# Patient Record
Sex: Female | Born: 1945 | Race: White | Hispanic: No | Marital: Married | Smoking: Former smoker
Health system: Southern US, Community
[De-identification: ages and names within clinical notes are randomized; demographics above are authoritative.]

## PROBLEM LIST (undated history)

## (undated) DIAGNOSIS — I82409 Acute embolism and thrombosis of unspecified deep veins of unspecified lower extremity: Secondary | ICD-10-CM

## (undated) DIAGNOSIS — I809 Phlebitis and thrombophlebitis of unspecified site: Secondary | ICD-10-CM

## (undated) DIAGNOSIS — I2699 Other pulmonary embolism without acute cor pulmonale: Secondary | ICD-10-CM

## (undated) DIAGNOSIS — Z9289 Personal history of other medical treatment: Secondary | ICD-10-CM

## (undated) DIAGNOSIS — T7840XA Allergy, unspecified, initial encounter: Secondary | ICD-10-CM

## (undated) DIAGNOSIS — I491 Atrial premature depolarization: Secondary | ICD-10-CM

## (undated) DIAGNOSIS — M8580 Other specified disorders of bone density and structure, unspecified site: Secondary | ICD-10-CM

## (undated) HISTORY — PX: BONE TUMOR EXCISION: SHX1254

## (undated) HISTORY — PX: COLONOSCOPY: SHX174

## (undated) HISTORY — PX: TONSILLECTOMY AND ADENOIDECTOMY: SUR1326

## (undated) HISTORY — DX: Personal history of other medical treatment: Z92.89

## (undated) HISTORY — DX: Atrial premature depolarization: I49.1

## (undated) HISTORY — PX: POLYPECTOMY: SHX149

## (undated) HISTORY — DX: Other specified disorders of bone density and structure, unspecified site: M85.80

## (undated) HISTORY — PX: OTHER SURGICAL HISTORY: SHX169

## (undated) HISTORY — DX: Phlebitis and thrombophlebitis of unspecified site: I80.9

## (undated) HISTORY — DX: Allergy, unspecified, initial encounter: T78.40XA

---
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
## 1983-08-06 HISTORY — PX: TUBAL LIGATION: SHX77

## 1999-04-02 ENCOUNTER — Other Ambulatory Visit: Admission: RE | Admit: 1999-04-02 | Discharge: 1999-04-02 | Payer: Self-pay | Admitting: Gynecology

## 2003-04-07 ENCOUNTER — Other Ambulatory Visit: Admission: RE | Admit: 2003-04-07 | Discharge: 2003-04-07 | Payer: Self-pay | Admitting: Gynecology

## 2004-04-17 ENCOUNTER — Other Ambulatory Visit: Admission: RE | Admit: 2004-04-17 | Discharge: 2004-04-17 | Payer: Self-pay | Admitting: Gynecology

## 2005-04-18 ENCOUNTER — Other Ambulatory Visit: Admission: RE | Admit: 2005-04-18 | Discharge: 2005-04-18 | Payer: Self-pay | Admitting: Gynecology

## 2006-10-08 ENCOUNTER — Other Ambulatory Visit: Admission: RE | Admit: 2006-10-08 | Discharge: 2006-10-08 | Payer: Self-pay | Admitting: Gynecology

## 2008-01-07 ENCOUNTER — Other Ambulatory Visit: Admission: RE | Admit: 2008-01-07 | Discharge: 2008-01-07 | Payer: Self-pay | Admitting: Gynecology

## 2008-08-05 DIAGNOSIS — Z9289 Personal history of other medical treatment: Secondary | ICD-10-CM

## 2008-08-05 HISTORY — DX: Personal history of other medical treatment: Z92.89

## 2010-10-16 ENCOUNTER — Other Ambulatory Visit (HOSPITAL_COMMUNITY)
Admission: RE | Admit: 2010-10-16 | Discharge: 2010-10-16 | Disposition: A | Discharge status: Home / Self Care | Payer: Managed Care, Other (non HMO) | Source: Ambulatory Visit | Attending: Gynecology | Admitting: Gynecology

## 2010-10-17 ENCOUNTER — Encounter (INDEPENDENT_AMBULATORY_CARE_PROVIDER_SITE_OTHER): Payer: Managed Care, Other (non HMO) | Admitting: Gynecology

## 2010-10-17 ENCOUNTER — Other Ambulatory Visit: Payer: Self-pay | Admitting: Gynecology

## 2010-10-17 DIAGNOSIS — Z01419 Encounter for gynecological examination (general) (routine) without abnormal findings: Secondary | ICD-10-CM

## 2010-10-17 DIAGNOSIS — Z1211 Encounter for screening for malignant neoplasm of colon: Secondary | ICD-10-CM

## 2010-10-17 DIAGNOSIS — Z124 Encounter for screening for malignant neoplasm of cervix: Secondary | ICD-10-CM | POA: Insufficient documentation

## 2010-12-26 ENCOUNTER — Encounter (INDEPENDENT_AMBULATORY_CARE_PROVIDER_SITE_OTHER): Payer: Managed Care, Other (non HMO)

## 2010-12-26 DIAGNOSIS — M949 Disorder of cartilage, unspecified: Secondary | ICD-10-CM

## 2011-01-04 ENCOUNTER — Other Ambulatory Visit (INDEPENDENT_AMBULATORY_CARE_PROVIDER_SITE_OTHER): Payer: Managed Care, Other (non HMO)

## 2011-01-04 DIAGNOSIS — M899 Disorder of bone, unspecified: Secondary | ICD-10-CM

## 2011-01-28 ENCOUNTER — Other Ambulatory Visit: Payer: Managed Care, Other (non HMO)

## 2011-01-30 ENCOUNTER — Ambulatory Visit (INDEPENDENT_AMBULATORY_CARE_PROVIDER_SITE_OTHER): Payer: Managed Care, Other (non HMO) | Admitting: Gynecology

## 2011-01-30 DIAGNOSIS — M899 Disorder of bone, unspecified: Secondary | ICD-10-CM

## 2011-04-25 ENCOUNTER — Telehealth: Payer: Self-pay | Admitting: *Deleted

## 2011-04-25 NOTE — Telephone Encounter (Signed)
Per Dr. Glenetta Hew  Pt will need to follow up with Dr. Tresa Endo to get fasting lipid profile and glucose. Pt can't labs drawn with an office visit and pt was since in march. Pt says she will follow up with dr. Tresa Endo.

## 2011-04-25 NOTE — Telephone Encounter (Signed)
PT CALLED WANTING TO KNOW IF SHE COULD GET FASTING LIPID PROFILE AND GLUCOSE LAB CHECK? PT STATES THIS WILL HELP WITH DISCOUNT ON MEDICAL INSURANCE. PLEASE ADVISE IF THIS IS OKAY FOR PT TO HAVE THIS DONE.

## 2011-05-02 ENCOUNTER — Ambulatory Visit (INDEPENDENT_AMBULATORY_CARE_PROVIDER_SITE_OTHER): Payer: Managed Care, Other (non HMO) | Admitting: Obstetrics & Gynecology

## 2011-05-02 ENCOUNTER — Encounter: Payer: Self-pay | Admitting: Obstetrics & Gynecology

## 2011-05-02 VITALS — BP 104/67 | HR 57 | Ht 65.0 in | Wt 159.0 lb

## 2011-05-02 DIAGNOSIS — Z0000 Encounter for general adult medical examination without abnormal findings: Secondary | ICD-10-CM

## 2011-05-02 NOTE — Progress Notes (Signed)
Patient here to establish care and obtain lab work for a work requirement.  Already has a regular doctor and regular GYN that have done all preventative and required health maintenance; but they were unable to see her soon for her lab work.  So she called and was able to get an appointment here.  No current GYN concerns.    The work form required documentation of today's vitals; waist and hip circumference (33.5" and 43" respectively) and a lipid panel that was ordered.  Will complete the form once the labs are resulted, scan a copy for her records, then fax to her work as requested.  Return to clinic or call for any GYN concerns or other preventative health maintenance.

## 2011-05-02 NOTE — Progress Notes (Signed)
Addended by: Jaynie Collins A on: 05/02/2011 11:28 AM   Modules accepted: Orders

## 2011-05-02 NOTE — Patient Instructions (Signed)

## 2011-05-02 NOTE — Progress Notes (Signed)
Addended by: Jaynie Collins A on: 05/02/2011 11:23 AM   Modules accepted: Orders

## 2011-05-03 LAB — LIPID PANEL
Cholesterol: 186 mg/dL (ref 0–200)
HDL: 49 mg/dL (ref >39)
Total CHOL/HDL Ratio: 3.8 Ratio
Triglycerides: 101 mg/dL (ref <150)
VLDL: 20 mg/dL (ref 0–40)

## 2011-05-06 NOTE — Progress Notes (Signed)
Form fill out and was fax.

## 2012-04-17 ENCOUNTER — Encounter: Payer: Self-pay | Admitting: Gynecology

## 2012-04-17 ENCOUNTER — Other Ambulatory Visit: Payer: Self-pay | Admitting: *Deleted

## 2012-04-17 DIAGNOSIS — R928 Other abnormal and inconclusive findings on diagnostic imaging of breast: Secondary | ICD-10-CM

## 2012-04-23 ENCOUNTER — Other Ambulatory Visit: Payer: Self-pay | Admitting: Gynecology

## 2012-04-23 DIAGNOSIS — R928 Other abnormal and inconclusive findings on diagnostic imaging of breast: Secondary | ICD-10-CM

## 2012-08-26 ENCOUNTER — Encounter: Payer: Managed Care, Other (non HMO) | Admitting: Gynecology

## 2012-09-02 ENCOUNTER — Encounter: Payer: Managed Care, Other (non HMO) | Admitting: Gynecology

## 2012-09-09 ENCOUNTER — Encounter: Payer: Self-pay | Admitting: Gynecology

## 2012-09-09 ENCOUNTER — Ambulatory Visit (INDEPENDENT_AMBULATORY_CARE_PROVIDER_SITE_OTHER): Payer: BC Managed Care – PPO | Admitting: Gynecology

## 2012-09-09 VITALS — BP 122/78 | Ht 64.25 in | Wt 167.0 lb

## 2012-09-09 DIAGNOSIS — Z01419 Encounter for gynecological examination (general) (routine) without abnormal findings: Secondary | ICD-10-CM

## 2012-09-09 DIAGNOSIS — M8580 Other specified disorders of bone density and structure, unspecified site: Secondary | ICD-10-CM | POA: Insufficient documentation

## 2012-09-09 DIAGNOSIS — Z8262 Family history of osteoporosis: Secondary | ICD-10-CM | POA: Insufficient documentation

## 2012-09-09 DIAGNOSIS — Z23 Encounter for immunization: Secondary | ICD-10-CM

## 2012-09-09 DIAGNOSIS — M899 Disorder of bone, unspecified: Secondary | ICD-10-CM

## 2012-09-09 DIAGNOSIS — M949 Disorder of cartilage, unspecified: Secondary | ICD-10-CM

## 2012-09-09 NOTE — Addendum Note (Signed)
Addended by: Bertram Savin A on: 09/09/2012 04:40 PM   Modules accepted: Orders

## 2012-09-09 NOTE — Patient Instructions (Signed)

## 2012-09-09 NOTE — Progress Notes (Addendum)
Cynthia Rasmussen October 16, 1945 [id number]   History:    67 y.o.  for annual gyn exam who has not been seen in the office since 2012. As of this date patient has been adamant on having a colonoscopy. Patient with no menopausal symptoms and on no hormone replacement therapy. Patient had a mammogram September 2013 and a right breast cyst was noted and radiologist has recommended a followup ultrasound and patient still in the process of scheduling. Patient would know prior abnormal Pap smears. Her last Pap smear was in 2012. Her last bone density study 2012 right femoral neck T score of -1.1 with a normal Frax analysis. The lowest T score was at the AP spine with a value of -1.4. Patient's mother with history of osteoporosis. Patient is taking calcium and vitamin D and is exercising regularly. Patient has not had her Tdap vaccine or her shingles vaccine.  Past medical history,surgical history, family history and social history were all reviewed and documented in the EPIC chart.  Gynecologic History No LMP recorded. Patient is postmenopausal. Contraception: post menopausal status Last Pap: 2012. Results were: normal Last mammogram: 2013. Results were: abnormal  Obstetric History OB History    Grav Para Term Preterm Abortions TAB SAB Ect Mult Living   3 3        3      # Outc Date GA Lbr Len/2nd Wgt Sex Del Anes PTL Lv   1 PAR            2 PAR            3 PAR                ROS: A ROS was performed and pertinent positives and negatives are included in the history.  GENERAL: No fevers or chills. HEENT: No change in vision, no earache, sore throat or sinus congestion. NECK: No pain or stiffness. CARDIOVASCULAR: No chest pain or pressure. No palpitations. PULMONARY: No shortness of breath, cough or wheeze. GASTROINTESTINAL: No abdominal pain, nausea, vomiting or diarrhea, melena or bright red blood per rectum. GENITOURINARY: No urinary frequency, urgency, hesitancy or dysuria. MUSCULOSKELETAL: No joint or  muscle pain, no back pain, no recent trauma. DERMATOLOGIC: No rash, no itching, no lesions. ENDOCRINE: No polyuria, polydipsia, no heat or cold intolerance. No recent change in weight. HEMATOLOGICAL: No anemia or easy bruising or bleeding. NEUROLOGIC: No headache, seizures, numbness, tingling or weakness. PSYCHIATRIC: No depression, no loss of interest in normal activity or change in sleep pattern.     Exam: chaperone present  BP 122/78  Ht 5' 4.25" (1.632 m)  Wt 167 lb (75.751 kg)  BMI 28.44 kg/m2  Body mass index is 28.44 kg/(m^2).  General appearance : Well developed well nourished female. No acute distress HEENT: Neck supple, trachea midline, no carotid bruits, no thyroidmegaly Lungs: Clear to auscultation, no rhonchi or wheezes, or rib retractions  Heart: Regular rate and rhythm, no murmurs or gallops Breast:Examined in sitting and supine position were symmetrical in appearance, no palpable masses or tenderness,  no skin retraction, no nipple inversion, no nipple discharge, no skin discoloration, no axillary or supraclavicular lymphadenopathy Abdomen: no palpable masses or tenderness, no rebound or guarding Extremities: no edema or skin discoloration or tenderness  Pelvic:  Bartholin, Urethra, Skene Glands: Within normal limits             Vagina: No gross lesions or discharge  Cervix: No gross lesions or discharge  Uterus  anteverted, normal size, shape and  consistency, non-tender and mobile  Adnexa  Without masses or tenderness  Anus and perineum  normal   Rectovaginal  normal sphincter tone without palpated masses or tenderness             Hemoccult cards provided     Assessment/Plan:  67 y.o. female for annual exam who was reminded to schedule her colonoscopy since she has not had a baseline. Hemoccult cards were provided for her to submit to the office today. We discussed the new tear screening guidelines. No Pap smear done today. The following labs ordered today: CBC,  screening cholesterol, hemoglobin A1c, TSH, urinalysis as well as vitamin D. Patient was also reminded to followup with the radiologist for the recommended ultrasound of her right breast. She will be due for bone density study next year. Tdap vaccine administered today. Prescription for her to get the shingles vaccine was provided as well.  Of note: Patient had been followed by Dr. Daphene Jaeger cardiologist back in 2011 as a result of prior history of palpitations with documented atrial ectopy with atrial bigeminy.APCs, and blocked  APCs were described. She also had an echo Doppler done in 2010 which showed normal systolic function. Patient was reminded to followup with him sometime this year. She was asymptomatic today.  Ok Edwards MD, 2:59 PM 09/09/2012

## 2012-09-10 ENCOUNTER — Other Ambulatory Visit: Payer: Self-pay | Admitting: Gynecology

## 2012-09-10 DIAGNOSIS — R7309 Other abnormal glucose: Secondary | ICD-10-CM

## 2012-09-10 LAB — URINALYSIS W MICROSCOPIC + REFLEX CULTURE
Bilirubin Urine: NEGATIVE
Crystals: NONE SEEN
Leukocytes, UA: NEGATIVE
Nitrite: NEGATIVE
Protein, ur: NEGATIVE mg/dL
Specific Gravity, Urine: 1.005 (ref 1.005–1.030)
Urobilinogen, UA: 0.2 mg/dL (ref 0.0–1.0)

## 2012-09-10 LAB — CBC WITH DIFFERENTIAL/PLATELET
Eosinophils Absolute: 0.1 10*3/uL (ref 0.0–0.7)
Hemoglobin: 14 g/dL (ref 12.0–15.0)
Lymphocytes Relative: 28 % (ref 12–46)
Lymphs Abs: 2 10*3/uL (ref 0.7–4.0)
MCH: 30.6 pg (ref 26.0–34.0)
MCV: 88.6 fL (ref 78.0–100.0)
Monocytes Relative: 7 % (ref 3–12)
Neutrophils Relative %: 64 % (ref 43–77)
Platelets: 318 10*3/uL (ref 150–400)
RBC: 4.57 MIL/uL (ref 3.87–5.11)
WBC: 7.2 10*3/uL (ref 4.0–10.5)

## 2012-09-10 LAB — TSH: TSH: 2.413 u[IU]/mL (ref 0.350–4.500)

## 2012-09-16 ENCOUNTER — Other Ambulatory Visit: Payer: BC Managed Care – PPO

## 2012-09-16 DIAGNOSIS — R7309 Other abnormal glucose: Secondary | ICD-10-CM

## 2012-09-16 LAB — GLUCOSE, FASTING: Glucose, Fasting: 79 mg/dL (ref 70–99)

## 2012-09-19 ENCOUNTER — Other Ambulatory Visit: Payer: Self-pay

## 2012-10-07 ENCOUNTER — Other Ambulatory Visit: Payer: Self-pay | Admitting: Anesthesiology

## 2012-10-07 ENCOUNTER — Other Ambulatory Visit: Payer: Self-pay | Admitting: Gynecology

## 2012-10-07 DIAGNOSIS — Z1211 Encounter for screening for malignant neoplasm of colon: Secondary | ICD-10-CM

## 2012-10-07 DIAGNOSIS — K921 Melena: Secondary | ICD-10-CM

## 2012-10-08 ENCOUNTER — Encounter: Payer: Self-pay | Admitting: Gastroenterology

## 2012-10-22 ENCOUNTER — Encounter: Payer: Self-pay | Admitting: Gynecology

## 2012-11-18 ENCOUNTER — Encounter: Payer: BC Managed Care – PPO | Admitting: Gastroenterology

## 2012-11-26 ENCOUNTER — Ambulatory Visit (AMBULATORY_SURGERY_CENTER): Payer: BC Managed Care – PPO | Admitting: *Deleted

## 2012-11-26 VITALS — Ht 64.0 in | Wt 163.8 lb

## 2012-11-26 DIAGNOSIS — Z1211 Encounter for screening for malignant neoplasm of colon: Secondary | ICD-10-CM

## 2012-11-26 MED ORDER — NA SULFATE-K SULFATE-MG SULF 17.5-3.13-1.6 GM/177ML PO SOLN: ORAL | Status: DC

## 2012-11-27 ENCOUNTER — Encounter: Payer: Self-pay | Admitting: Gastroenterology

## 2012-12-11 ENCOUNTER — Encounter: Payer: Self-pay | Admitting: Gastroenterology

## 2012-12-11 ENCOUNTER — Ambulatory Visit (AMBULATORY_SURGERY_CENTER): Payer: BC Managed Care – PPO | Admitting: Gastroenterology

## 2012-12-11 VITALS — BP 119/73 | HR 54 | Temp 98.2°F | Resp 16 | Ht 64.0 in | Wt 163.0 lb

## 2012-12-11 DIAGNOSIS — D126 Benign neoplasm of colon, unspecified: Secondary | ICD-10-CM

## 2012-12-11 DIAGNOSIS — Z1211 Encounter for screening for malignant neoplasm of colon: Secondary | ICD-10-CM

## 2012-12-11 MED ORDER — SODIUM CHLORIDE 0.9 % IV SOLN: 500.0000 mL | INTRAVENOUS | Status: DC

## 2012-12-11 NOTE — Patient Instructions (Addendum)
Impressions/recommendations:  Polyp (handout given)  Repeat colonoscopy pending pathology results.  YOU HAD AN ENDOSCOPIC PROCEDURE TODAY AT THE [facility]: Refer to the procedure report that was given to you for any specific questions about what was found during the examination.  If the procedure report does not answer your questions, please call your gastroenterologist to clarify.  If you requested that your care partner not be given the details of your procedure findings, then the procedure report has been included in a sealed envelope for you to review at your convenience later.  YOU SHOULD EXPECT: Some feelings of bloating in the abdomen. Passage of more gas than usual.  Walking can help get rid of the air that was put into your GI tract during the procedure and reduce the bloating. If you had a lower endoscopy (such as a colonoscopy or flexible sigmoidoscopy) you may notice spotting of blood in your stool or on the toilet paper. If you underwent a bowel prep for your procedure, then you may not have a normal bowel movement for a few days.  DIET: Your first meal following the procedure should be a light meal and then it is ok to progress to your normal diet.  A half-sandwich or bowl of soup is an example of a good first meal.  Heavy or fried foods are harder to digest and may make you feel nauseous or bloated.  Likewise meals heavy in dairy and vegetables can cause extra gas to form and this can also increase the bloating.  Drink plenty of fluids but you should avoid alcoholic beverages for 24 hours.  ACTIVITY: Your care partner should take you home directly after the procedure.  You should plan to take it easy, moving slowly for the rest of the day.  You can resume normal activity the day after the procedure however you should NOT DRIVE or use heavy machinery for 24 hours (because of the sedation medicines used during the test).    SYMPTOMS TO REPORT IMMEDIATELY: A  gastroenterologist can be reached at any hour.  During normal business hours, 8:30 AM to 5:00 PM Monday through Friday, call [phone].  After hours and on weekends, please call the GI answering service at [phone] who will take a message and have the physician on call contact you.   Following lower endoscopy (colonoscopy or flexible sigmoidoscopy):  Excessive amounts of blood in the stool  Significant tenderness or worsening of abdominal pains  Swelling of the abdomen that is new, acute  Fever of 100F or higher  Following upper endoscopy (EGD)  Vomiting of blood or coffee ground material  New chest pain or pain under the shoulder blades  Painful or persistently difficult swallowing  New shortness of breath  Fever of 100F or higher  Black, tarry-looking stools  FOLLOW UP: If any biopsies were taken you will be contacted by phone or by letter within the next 1-3 weeks.  Call your gastroenterologist if you have not heard about the biopsies in 3 weeks.  Our staff will call the home number listed on your records the next business day following your procedure to check on you and address any questions or concerns that you may have at that time regarding the information given to you following your procedure. This is a courtesy call and so if there is no answer at the home number and we have not heard from you through the emergency physician on call, we will assume that you have returned to  your regular daily activities without incident.  SIGNATURES/CONFIDENTIALITY: You and/or your care partner have signed paperwork which will be entered into your electronic medical record.  These signatures attest to the fact that that the information above on your After Visit Summary has been reviewed and is understood.  Full responsibility of the confidentiality of this discharge information lies with you and/or your care-partner.YOU HAD AN ENDOSCOPIC PROCEDURE TODAY AT THE [facility]:  Refer to the procedure report that was given to you for any specific questions about what was found during the examination.  If the procedure report does not answer your questions, please call your gastroenterologist to clarify.  If you requested that your care partner not be given the details of your procedure findings, then the procedure report has been included in a sealed envelope for you to review at your convenience later.  YOU SHOULD EXPECT: Some feelings of bloating in the abdomen. Passage of more gas than usual.  Walking can help get rid of the air that was put into your GI tract during the procedure and reduce the bloating. If you had a lower endoscopy (such as a colonoscopy or flexible sigmoidoscopy) you may notice spotting of blood in your stool or on the toilet paper. If you underwent a bowel prep for your procedure, then you may not have a normal bowel movement for a few days.  DIET: Your first meal following the procedure should be a light meal and then it is ok to progress to your normal diet.  A half-sandwich or bowl of soup is an example of a good first meal.  Heavy or fried foods are harder to digest and may make you feel nauseous or bloated.  Likewise meals heavy in dairy and vegetables can cause extra gas to form and this can also increase the bloating.  Drink plenty of fluids but you should avoid alcoholic beverages for 24 hours.  ACTIVITY: Your care partner should take you home directly after the procedure.  You should plan to take it easy, moving slowly for the rest of the day.  You can resume normal activity the day after the procedure however you should NOT DRIVE or use heavy machinery for 24 hours (because of the sedation medicines used during the test).    SYMPTOMS TO REPORT IMMEDIATELY: A gastroenterologist can be reached at any hour.  During normal business hours, 8:30 AM to 5:00 PM Monday through Friday, call [phone].  After hours and on weekends, please call the GI  answering service at [phone] who will take a message and have the physician on call contact you.   Following lower endoscopy (colonoscopy or flexible sigmoidoscopy):  Excessive amounts of blood in the stool  Significant tenderness or worsening of abdominal pains  Swelling of the abdomen that is new, acute  Fever of 100F or higher  FOLLOW UP: If any biopsies were taken you will be contacted by phone or by letter within the next 1-3 weeks.  Call your gastroenterologist if you have not heard about the biopsies in 3 weeks.  Our staff will call the home number listed on your records the next business day following your procedure to check on you and address any questions or concerns that you may have at that time regarding the information given to you following your procedure. This is a courtesy call and so if there is no answer at the home number and we have not heard from you through the emergency physician on call,  we will assume that you have returned to your regular daily activities without incident.  SIGNATURES/CONFIDENTIALITY: You and/or your care partner have signed paperwork which will be entered into your electronic medical record.  These signatures attest to the fact that that the information above on your After Visit Summary has been reviewed and is understood.  Full responsibility of the confidentiality of this discharge information lies with you and/or your care-partner.

## 2012-12-11 NOTE — Progress Notes (Signed)
Report to pacu RN, vss, bbs=clear 

## 2012-12-11 NOTE — Op Note (Addendum)
[facility] [address] [place], [address]   COLONOSCOPY PROCEDURE REPORT  PATIENT: Cynthia, Rasmussen  MR#: [id number] BIRTHDATE: 01-25-1946 , 66  yrs. old GENDER: Female ENDOSCOPIST: Louis Meckel, MD REFERRED XB:JYNW Fernandez, M.D. PROCEDURE DATE:  12/11/2012 PROCEDURE:   Colonoscopy with snare polypectomy and Submucosal injection, any substance ASA CLASS:   Class II INDICATIONS:average risk screening. MEDICATIONS: MAC sedation, administered by CRNA and propofol (Diprivan) 400mg  IV  DESCRIPTION OF PROCEDURE:   After the risks benefits and alternatives of the procedure were thoroughly explained, informed consent was obtained.  A digital rectal exam revealed no abnormalities of the rectum.   The LB [id number]  endoscope was introduced through the anus and advanced to the cecum, which was identified by both the appendix and ileocecal valve. No adverse events experienced.   The quality of the prep was excellent using Suprep  The instrument was then slowly withdrawn as the colon was fully examined.      COLON FINDINGS: A semi-pedunculated polyp was found at the splenic flexure.  A polypectomy was performed using snare cautery.  The resection was complete and the polyp tissue was completely retrieved.  A saline Injection (4.5cc) was given to lift the mucosal wall. The area was marked by "spot".  In the sigmoid at 20cm there was a 4mm sessile polyp.  It was removed with cold polypectomy snare and submitted to pathology.  The remainder of the colon was normal.   Retroflexed view of the rectal vault was normal.   The time to cecum=5 minutes 51 seconds.  Withdrawal time=26 minutes 09 seconds.  The scope was withdrawn and the procedure completed. COMPLICATIONS: There were no complications.  ENDOSCOPIC IMPRESSION: Semi-pedunculated polyp was found at the splenic flexure; polypectomy was performed using snare cautery; saline was given to lift the  mucosal wall  RECOMMENDATIONS: Await biopsy results   eSigned:  Louis Meckel, MD 12/11/2012 9:01 AM   cc:   PATIENT NAME:  Cynthia, Rasmussen MR#: [id number]

## 2012-12-11 NOTE — Progress Notes (Signed)
Patient did not experience any of the following events: a burn prior to discharge; a fall within the facility; wrong site/side/patient/procedure/implant event; or a hospital transfer or hospital admission upon discharge from the facility. (G8907) Patient did not have preoperative order for IV antibiotic SSI prophylaxis. (G8918)  

## 2012-12-11 NOTE — Progress Notes (Signed)
Called to room to assist during endoscopic procedure.  Patient ID and intended procedure confirmed with present staff. Received instructions for my participation in the procedure from the performing physician. ewm 

## 2012-12-14 ENCOUNTER — Telehealth: Payer: Self-pay

## 2012-12-14 NOTE — Telephone Encounter (Signed)
  Follow up Call-  Call back number 12/11/2012  Post procedure Call Back phone  # [phone]  Permission to leave phone message Yes     Patient questions:  Do you have a fever, pain , or abdominal swelling? no Pain Score  0 *  Have you tolerated food without any problems? yes  Have you been able to return to your normal activities? yes  Do you have any questions about your discharge instructions: Diet   no Medications  no Follow up visit  no  Do you have questions or concerns about your Care? no  Actions: * If pain score is 4 or above: No action needed, pain <4.   Per the pt no problems. Maw

## 2012-12-16 ENCOUNTER — Encounter: Payer: Self-pay | Admitting: Gastroenterology

## 2013-04-23 ENCOUNTER — Other Ambulatory Visit: Payer: Self-pay | Admitting: *Deleted

## 2013-04-23 DIAGNOSIS — N6310 Unspecified lump in the right breast, unspecified quadrant: Secondary | ICD-10-CM

## 2013-05-14 ENCOUNTER — Encounter: Payer: Self-pay | Admitting: Gynecology

## 2013-05-18 ENCOUNTER — Encounter: Payer: Self-pay | Admitting: Gynecology

## 2013-05-26 ENCOUNTER — Ambulatory Visit (INDEPENDENT_AMBULATORY_CARE_PROVIDER_SITE_OTHER): Payer: BC Managed Care – PPO | Admitting: Cardiovascular Disease

## 2013-05-26 ENCOUNTER — Encounter: Payer: Self-pay | Admitting: Cardiovascular Disease

## 2013-05-26 VITALS — BP 118/80 | HR 55 | Ht 64.5 in | Wt 165.5 lb

## 2013-05-26 DIAGNOSIS — I059 Rheumatic mitral valve disease, unspecified: Secondary | ICD-10-CM

## 2013-05-26 DIAGNOSIS — R002 Palpitations: Secondary | ICD-10-CM

## 2013-05-26 DIAGNOSIS — I340 Nonrheumatic mitral (valve) insufficiency: Secondary | ICD-10-CM

## 2013-05-26 DIAGNOSIS — I119 Hypertensive heart disease without heart failure: Secondary | ICD-10-CM

## 2013-05-26 MED ORDER — NEBIVOLOL HCL 5 MG PO TABS: ORAL_TABLET | ORAL | Status: DC

## 2013-05-26 NOTE — Progress Notes (Signed)
Patient ID: Cynthia Rasmussen, female   DOB: 1946/05/23, 67 y.o.   MRN: [id number]     HPI: Cynthia Rasmussen is a 67 y.o. female who presents to the office today for a two-year cardiology evaluation. I last saw her in October 2012.  This all has a history of atrial ectopy/bigeminy with APCs as well as blocked APCs that has been treated with beta blocker therapy. Initially she developed significant fatigue on Toprol but seems to have tolerated the systolic which she takes at low dose. A 2-D echo Doppler study was done in 2010 which showed normal systolic function and possible early borderline diastolic dysfunction with an E/A ratio of 0.94. She had normal pulmonary pressures. She did have mild thickening of her mitral valve leaflets without definitive prolapse and had mild mitral regurgitation.  She is followed Dr. Reynaldo Minium. She does have a history of osteopenia. She tells me he has checked complete sets of laboratory including lipid studies. She takes vitamins.  She denies any change in exercise capacity. She cannot walks 5 days per week. She denies exercise-induced palpitations. There are no episodes of chest pressure. She denies PND or orthopnea. She is unaware of any sleep apnea.  Past Medical History  Diagnosis Date  . Thrombophlebitis     WITH PREGNANCY  . Osteopenia   . APC (atrial premature contractions)     DR. Tresa Endo    Past Surgical History  Procedure Laterality Date  . 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 Tubal ligation  1985  . Wisdom tooth extraction  1965  . Tonsillectomy and adenoidectomy      No Known Allergies  Current Outpatient Prescriptions  Medication Sig Dispense Refill  . Calcium-Vitamin D-Vitamin K (VIACTIV PO) Take by mouth.        . Docosahexaenoic Acid (DHA COMPLETE PO) Take 30 mg by mouth daily.      . Multiple Vitamin (MULTIVITAMIN) tablet Take 1 tablet by mouth daily.      . psyllium (REGULOID) 0.52 G capsule Take 0.52 g by mouth daily.       . nebivolol (BYSTOLIC) 5 MG tablet Take 1/2 -1 tablet daily  90 tablet  3   No current facility-administered medications for this visit.    History   Social History  . Marital Status: Married    Spouse Name: N/A    Number of Children: N/A  . Years of Education: N/A   Occupational History  . Not on file.   Social History Main Topics  . Smoking status: Former Smoker    Quit date: 11/27/1975  . Smokeless tobacco: Never Used  . Alcohol Use: 1.2 oz/week    2 Glasses of wine per week  . Drug Use: No  . Sexual Activity: Yes    Partners: Male   Other Topics Concern  . Not on file   Social History Narrative  . No narrative on file    Family History  Problem Relation Age of Onset  . Osteoporosis Mother   . Hypertension Father   . Heart disease Father   . Hypertension Sister   . Colon cancer Neg Hx    Additional social history is notable that she is originally from outside of [place] area. She has 3 children ages 51 and 62, one child lives in [place], [place] and the others live in [place]. 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 She completed college. His remote tobacco  history for 10 years but she quit in 1977. She does drink occasional wine.  ROS is negative for fevers, chills or night sweats. She does wear glasses. She denies visual changes. She denies episodes of tachycardia. She denies recent fatigue. He denies wheezing. She denies cough or increased sputum production. She denies chest pressure. There is no chest wall pain. She denies nausea vomiting diarrhea. She denies any change in bowel or bladder habits. She denies an hematura or hematochezia. There is no claudication. There is no myalgias. She denies tremors. She denies heat or cold intolerance. Other comprehensive 12 point system review is negative.  PE BP 118/80  Pulse 55  Ht 5' 4.5" (1.638 m)  Wt 165 lb 8 oz (75.07 kg)  BMI 27.98 kg/m2  General: Alert, oriented, no  distress.  Skin: normal turgor, no rashes HEENT: Normocephalic, atraumatic. Pupils round and reactive; sclera anicteric;no lid lag.  Nose without nasal septal hypertrophy Mouth/Parynx benign; Mallinpatti scale 2 Neck: No JVD, no carotid briuts Lungs: clear to ausculatation and percussion; no wheezing or rales Heart: RRR, s1 s2 normal 1/6 systolic murmur compatible with mild mitral regurgitation. I did not hear any systolic clicks. Abdomen: soft, nontender; no hepatosplenomehaly, BS+; abdominal aorta nontender and not dilated by palpation. Pulses 2+ Extremities: no clubbing cyanosis or edema, Homan's sign negative  Neurologic: grossly nonfocal Psychologic: normal affect and mood.  ECG: Sinus rhythm at 55 beats per minute. Intervals are normal.  LABS:  BMET    Component Value Date/Time   GLUCOSE 79 05/02/2011 1216     Hepatic Function Panel  No results found for this basename: prot, albumin, ast, alt, alkphos, bilitot, bilidir, ibili     CBC    Component Value Date/Time   WBC 7.2 09/09/2012 1504   RBC 4.57 09/09/2012 1504   HGB 14.0 09/09/2012 1504   HCT 40.5 09/09/2012 1504   PLT 318 09/09/2012 1504   MCV 88.6 09/09/2012 1504   MCH 30.6 09/09/2012 1504   MCHC 34.6 09/09/2012 1504   RDW 13.8 09/09/2012 1504   LYMPHSABS 2.0 09/09/2012 1504   MONOABS 0.5 09/09/2012 1504   EOSABS 0.1 09/09/2012 1504   BASOSABS 0.0 09/09/2012 1504     BNP No results found for this basename: probnp    Lipid Panel     Component Value Date/Time   CHOL 173 09/09/2012 1504   TRIG 101 05/02/2011 1120   HDL 49 05/02/2011 1120   CHOLHDL 3.8 05/02/2011 1120   VLDL 20 05/02/2011 1120   LDLCALC 117* 05/02/2011 1120     RADIOLOGY: No results found.    ASSESSMENT AND PLAN:  From a cardiac standpoint, Ms. Hanser is doing well. She is tolerating low-dose Bystolic without side effects. She feels that this has completely controlled her palpitations. She does have osteopenia. She states her lipid studies have been  excellent when checked by Dr. Lily Peer. The last record that I see is from 2012 which showed an LDL cholesterol of 117, HDL cholesterol 49, triglycerides 101, and total cholesterol 173. She continues to exercise regularly. I have recommended that she continue her current medical regimen. She sees Dr. Lily Peer on a yearly basis. As long as she remains stable I will see her in 2 years for followup evaluation or sooner if problems arise    Lennette Bihari, MD, [facility]  05/26/2013 4:23 PM

## 2013-05-26 NOTE — Patient Instructions (Signed)
Your physician recommends that you schedule a follow-up appointment in: 1 YEAR. No changes were made today in your therapy. 

## 2013-05-27 ENCOUNTER — Encounter: Payer: Self-pay | Admitting: Cardiovascular Disease

## 2013-06-10 ENCOUNTER — Other Ambulatory Visit: Payer: Self-pay

## 2013-10-21 ENCOUNTER — Encounter: Payer: Self-pay | Admitting: Gastroenterology

## 2013-11-17 ENCOUNTER — Encounter: Payer: Self-pay | Admitting: Gastroenterology

## 2013-12-08 ENCOUNTER — Encounter: Payer: Self-pay | Admitting: Gynecology

## 2013-12-22 ENCOUNTER — Encounter: Payer: Self-pay | Admitting: Gynecology

## 2013-12-22 ENCOUNTER — Other Ambulatory Visit (HOSPITAL_COMMUNITY)
Admission: RE | Admit: 2013-12-22 | Discharge: 2013-12-22 | Disposition: A | Discharge status: Home / Self Care | Payer: BC Managed Care – PPO | Source: Ambulatory Visit | Attending: Gynecology | Admitting: Gynecology

## 2013-12-22 ENCOUNTER — Ambulatory Visit (INDEPENDENT_AMBULATORY_CARE_PROVIDER_SITE_OTHER): Payer: BC Managed Care – PPO | Admitting: Gynecology

## 2013-12-22 VITALS — BP 126/78 | Ht 64.25 in | Wt 162.0 lb

## 2013-12-22 DIAGNOSIS — Z01419 Encounter for gynecological examination (general) (routine) without abnormal findings: Secondary | ICD-10-CM | POA: Insufficient documentation

## 2013-12-22 DIAGNOSIS — M949 Disorder of cartilage, unspecified: Secondary | ICD-10-CM

## 2013-12-22 DIAGNOSIS — Z1151 Encounter for screening for human papillomavirus (HPV): Secondary | ICD-10-CM | POA: Insufficient documentation

## 2013-12-22 DIAGNOSIS — Z780 Asymptomatic menopausal state: Secondary | ICD-10-CM

## 2013-12-22 DIAGNOSIS — N951 Menopausal and female climacteric states: Secondary | ICD-10-CM

## 2013-12-22 DIAGNOSIS — N952 Postmenopausal atrophic vaginitis: Secondary | ICD-10-CM

## 2013-12-22 DIAGNOSIS — M899 Disorder of bone, unspecified: Secondary | ICD-10-CM

## 2013-12-22 DIAGNOSIS — M8580 Other specified disorders of bone density and structure, unspecified site: Secondary | ICD-10-CM

## 2013-12-22 NOTE — Progress Notes (Signed)
Cynthia Rasmussen Born August 30, 1945 [id number]   History:    68 y.o.  for annual gyn exam who finally went to see the gastroenterologist after her fecal Hemoccult testing was positive. Patient saw Dr. Deatra Ina who did her colonoscopy and removed a polypoid lesion with pathology report as follows: Diagnosis 1. 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 - SEE COMMENT. 2. Surgical [P], sigmoid polyp, bx (multiple pieces) - BENIGN POLYPOID COLORECTAL MUCOSA WITH A LYMPHOID AGGREGATE. - THERE IS NO EVIDENCE OF MALIGNANCY.  The patient has up within the next few weeks. Patient has also been followed by the cardiologist Dr. Ellouise Newer for her hypertension. Her PCP will be drawn her blood work as well. Patient with no prior history of abnormal Pap smear.Her last bone density study 2012 right femoral neck T score of -1.1 with a normal Frax analysis. The lowest T score was at the AP spine with a value of -1.4. Patient's mother with history of osteoporosis. Patient is taking calcium and vitamin D and is exercising regularly. Patient's Tdap vaccine is up-to-date that she is not receive the shingles are seen yet. Patient on no hormone replacement therapy.   Past medical history,surgical history, family history and social history were all reviewed and documented in the EPIC chart.  Gynecologic History No LMP recorded. Patient is postmenopausal. Contraception: post menopausal status Last Pap: 2012. Results were: normal Last mammogram: 2014. Results were: normal  Obstetric History OB History  Gravida Para Term Preterm AB SAB TAB Ectopic Multiple Living  3 3        3     # Outcome Date GA Lbr Len/2nd Weight Sex Delivery Anes PTL Lv  3 PAR           2 PAR           1 PAR                ROS: A ROS was performed and pertinent positives and negatives are included in the history.  GENERAL: No fevers or chills. HEENT: No change in  vision, no earache, sore throat or sinus congestion. NECK: No pain or stiffness. CARDIOVASCULAR: No chest pain or pressure. No palpitations. PULMONARY: No shortness of breath, cough or wheeze. GASTROINTESTINAL: No abdominal pain, nausea, vomiting or diarrhea, melena or bright red blood per rectum. GENITOURINARY: No urinary frequency, urgency, hesitancy or dysuria. MUSCULOSKELETAL: No joint or muscle pain, no back pain, no recent trauma. DERMATOLOGIC: No rash, no itching, no lesions. ENDOCRINE: No polyuria, polydipsia, no heat or cold intolerance. No recent change in weight. HEMATOLOGICAL: No anemia or easy bruising or bleeding. NEUROLOGIC: No headache, seizures, numbness, tingling or weakness. PSYCHIATRIC: No depression, no loss of interest in normal activity or change in sleep pattern.     Exam: chaperone present  BP 126/78  Ht 5' 4.25" (1.632 m)  Wt 162 lb (73.483 kg)  BMI 27.59 kg/m2  Body mass index is 27.59 kg/(m^2).  General appearance : Well developed well nourished female. No acute distress HEENT: Neck supple, trachea midline, no carotid bruits, no thyroidmegaly Lungs: Clear to auscultation, no rhonchi or wheezes, or rib retractions  Heart: Regular rate and rhythm, no murmurs or gallops Breast:Examined in sitting and supine position were symmetrical in appearance, no palpable masses or tenderness,  no skin retraction, no nipple inversion, no nipple discharge, no skin discoloration, no axillary or supraclavicular lymphadenopathy Abdomen: no palpable masses or tenderness, no rebound or guarding  Extremities: no edema or skin discoloration or tenderness  Pelvic:  Bartholin, Urethra, Skene Glands: Within normal limits             Vagina: No gross lesions or discharge, mild atrophic changes  Cervix: No gross lesions or discharge  Uterus  anteverted, normal size, shape and consistency, non-tender and mobile  Adnexa  Without masses or tenderness  Anus and perineum  normal   Rectovaginal   normal sphincter tone without palpated masses or tenderness             Hemoccult has follow up with gastroenterologist in the next 2 weeks     Assessment/Plan:  68 y.o. female for annual exam will be due for her bone density study later this year as well as her mammogram. She is following up with her gastroenterologist as a result of her tubulovillous adenoma with high-grade dysplasia. Her PCP and cardiologist will be drawn her blood work. Pap smear was done today in accordance to the new guidelines. We discussed importance of calcium vitamin D and regular exercise for osteoporosis prevention. Requisition to obtain her shingles vaccine at her local pharmacy was provided.  Note: This dictation was prepared with  Dragon/digital dictation along withSmart phrase technology. Any transcriptional errors that result from this process are unintentional.   Terrance Mass MD, 5:06 PM 12/22/2013

## 2013-12-22 NOTE — Patient Instructions (Signed)
Shingles Vaccine  What You Need to Know  WHAT IS SHINGLES?  · Shingles is a painful skin rash, often with blisters. It is also called Herpes Zoster or just Zoster.  · A shingles rash usually appears on one side of the face or body and lasts from 2 to 4 weeks. Its main symptom is pain, which can be quite severe. Other symptoms of shingles can include fever, headache, chills, and upset stomach. Very rarely, a shingles infection can lead to pneumonia, hearing problems, blindness, brain inflammation (encephalitis), or death.  · For about 1 person in 5, severe pain can continue even after the rash clears up. This is called post-herpetic neuralgia.  · Shingles is caused by the Varicella Zoster virus. This is the same virus that causes chickenpox. Only someone who has had a case of chickenpox or rarely, has gotten chickenpox vaccine, can get shingles. The virus stays in your body. It can reappear many years later to cause a case of shingles.  · You cannot catch shingles from another person with shingles. However, a person who has never had chickenpox (or chickenpox vaccine) could get chickenpox from someone with shingles. This is not very common.  · Shingles is far more common in people 50 and older than in younger people. It is also more common in people whose immune systems are weakened because of a disease such as cancer or drugs such as steroids or chemotherapy.  · At least 1 million people get shingles per year in the [place].  SHINGLES VACCINE  · A vaccine for shingles was licensed in 2006. In clinical trials, the vaccine reduced the risk of shingles by 50%. It can also reduce the pain in people who still get shingles after being vaccinated.  · A single dose of shingles vaccine is recommended for adults 60 years of age and older.  SOME PEOPLE SHOULD NOT GET SHINGLES VACCINE OR SHOULD WAIT  A person should not get shingles vaccine if he or she:  · Has ever had a life-threatening allergic reaction to gelatin, the  antibiotic neomycin, or any other component of shingles vaccine. Tell your caregiver if you have any severe allergies.  · Has a weakened immune system because of current:  · AIDS or another disease that affects the immune system.  · Treatment with drugs that affect the immune system, such as prolonged use of high-dose steroids.  · Cancer treatment, such as radiation or chemotherapy.  · Cancer affecting the bone marrow or lymphatic system, such as leukemia or lymphoma.  · Is pregnant, or might be pregnant. Women should not become pregnant until at least 4 weeks after getting shingles vaccine.  Someone with a minor illness, such as a cold, may be vaccinated. Anyone with a moderate or severe acute illness should usually wait until he or she recovers before getting the vaccine. This includes anyone with a temperature of 101.3° F (38° C) or higher.  WHAT ARE THE RISKS FROM SHINGLES VACCINE?  · A vaccine, like any medicine, could possibly cause serious problems, such as severe allergic reactions. However, the risk of a vaccine causing serious harm, or death, is extremely small.  · No serious problems have been identified with shingles vaccine.  Mild Problems  · Redness, soreness, swelling, or itching at the site of the injection (about 1 person in 3).  · Headache (about 1 person in 70).  Like all vaccines, shingles vaccine is being closely monitored for unusual or severe problems.  WHAT IF   THERE IS A MODERATE OR SEVERE REACTION?  What should I look for?  Any unusual condition, such as a severe allergic reaction or a high fever. If a severe allergic reaction occurred, it would be within a few minutes to an hour after the shot. Signs of a serious allergic reaction can include difficulty breathing, weakness, hoarseness or wheezing, a fast heartbeat, hives, dizziness, paleness, or swelling of the throat.  What should I do?  · Call your caregiver, or get the person to a caregiver right away.  · Tell the caregiver what  happened, the date and time it happened, and when the vaccination was given.  · Ask the caregiver to report the reaction by filing a Vaccine Adverse Event Reporting System (VAERS) form. Or, you can file this report through the VAERS web site at www.vaers.hhs.gov or by calling 1-800-822-7967.  VAERS does not provide medical advice.  HOW CAN I LEARN MORE?  · Ask your caregiver. He or she can give you the vaccine package insert or suggest other sources of information.  · Contact the Centers for Disease Control and Prevention (CDC):  · Call 1-800-232-4636 (1-800-CDC-INFO).  · Visit the CDC website at www.cdc.gov/vaccines  CDC Shingles Vaccine VIS (05/10/08)  Document Released: 05/19/2006 Document Revised: 10/14/2011 Document Reviewed: 11/11/2012  ExitCare® Patient Information ©2014 [employer].

## 2014-01-14 ENCOUNTER — Ambulatory Visit (AMBULATORY_SURGERY_CENTER): Payer: Self-pay

## 2014-01-14 VITALS — Ht 64.25 in | Wt 163.8 lb

## 2014-01-14 DIAGNOSIS — Z86010 Personal history of colonic polyps: Secondary | ICD-10-CM

## 2014-01-14 MED ORDER — NA SULFATE-K SULFATE-MG SULF 17.5-3.13-1.6 GM/177ML PO SOLN: ORAL | Status: DC

## 2014-01-14 NOTE — Progress Notes (Signed)
Per pt, no allergies to soy or egg products.Pt not taking any weight loss meds or using  O2 at home. 

## 2014-01-28 ENCOUNTER — Encounter: Payer: Self-pay | Admitting: Gastroenterology

## 2014-01-28 ENCOUNTER — Ambulatory Visit (AMBULATORY_SURGERY_CENTER): Payer: BC Managed Care – PPO | Admitting: Gastroenterology

## 2014-01-28 VITALS — BP 111/57 | HR 49 | Temp 97.2°F | Resp 15 | Ht 64.25 in | Wt 163.0 lb

## 2014-01-28 DIAGNOSIS — Z86010 Personal history of colonic polyps: Secondary | ICD-10-CM

## 2014-01-28 MED ORDER — SODIUM CHLORIDE 0.9 % IV SOLN
500.0000 mL | INTRAVENOUS | Status: DC
Start: 2014-01-28 — End: 2014-02-05

## 2014-01-28 NOTE — Progress Notes (Signed)
Procedure ends, to recovery, report given and VSS. 

## 2014-01-28 NOTE — Patient Instructions (Signed)
Discharge instructions given with verbal understanding. Normal exam. Resume previous medications. YOU HAD AN ENDOSCOPIC PROCEDURE TODAY AT THE [facility]: Refer to the procedure report that was given to you for any specific questions about what was found during the examination.  If the procedure report does not answer your questions, please call your gastroenterologist to clarify.  If you requested that your care partner not be given the details of your procedure findings, then the procedure report has been included in a sealed envelope for you to review at your convenience later.  YOU SHOULD EXPECT: Some feelings of bloating in the abdomen. Passage of more gas than usual.  Walking can help get rid of the air that was put into your GI tract during the procedure and reduce the bloating. If you had a lower endoscopy (such as a colonoscopy or flexible sigmoidoscopy) you may notice spotting of blood in your stool or on the toilet paper. If you underwent a bowel prep for your procedure, then you may not have a normal bowel movement for a few days.  DIET: Your first meal following the procedure should be a light meal and then it is ok to progress to your normal diet.  A half-sandwich or bowl of soup is an example of a good first meal.  Heavy or fried foods are harder to digest and may make you feel nauseous or bloated.  Likewise meals heavy in dairy and vegetables can cause extra gas to form and this can also increase the bloating.  Drink plenty of fluids but you should avoid alcoholic beverages for 24 hours.  ACTIVITY: Your care partner should take you home directly after the procedure.  You should plan to take it easy, moving slowly for the rest of the day.  You can resume normal activity the day after the procedure however you should NOT DRIVE or use heavy machinery for 24 hours (because of the sedation medicines used during the test).    SYMPTOMS TO REPORT IMMEDIATELY: A gastroenterologist  can be reached at any hour.  During normal business hours, 8:30 AM to 5:00 PM Monday through Friday, call [phone].  After hours and on weekends, please call the GI answering service at [phone] who will take a message and have the physician on call contact you.   Following lower endoscopy (colonoscopy or flexible sigmoidoscopy):  Excessive amounts of blood in the stool  Significant tenderness or worsening of abdominal pains  Swelling of the abdomen that is new, acute  Fever of 100F or higher  FOLLOW UP: If any biopsies were taken you will be contacted by phone or by letter within the next 1-3 weeks.  Call your gastroenterologist if you have not heard about the biopsies in 3 weeks.  Our staff will call the home number listed on your records the next business day following your procedure to check on you and address any questions or concerns that you may have at that time regarding the information given to you following your procedure. This is a courtesy call and so if there is no answer at the home number and we have not heard from you through the emergency physician on call, we will assume that you have returned to your regular daily activities without incident.  SIGNATURES/CONFIDENTIALITY: You and/or your care partner have signed paperwork which will be entered into your electronic medical record.  These signatures attest to the fact that that the information above on your After Visit Summary has been reviewed   and is understood.  Full responsibility of the confidentiality of this discharge information lies with you and/or your care-partner. 

## 2014-01-28 NOTE — Op Note (Signed)
[facility]  [address] [place], [id number]   COLONOSCOPY PROCEDURE REPORT  PATIENT: Cynthia Rasmussen, Cynthia Rasmussen  MR#: [id number] BIRTHDATE: 08/09/1945 , 67  yrs. old GENDER: Female ENDOSCOPIST: Inda Castle, MD REFERRED EP:PIRJJOA Kenton Kingfisher, M.D. PROCEDURE DATE:  01/28/2014 PROCEDURE:   Colonoscopy, diagnostic First Screening Colonoscopy - Avg.  risk and is 50 yrs.  old or older - No.  Prior Negative Screening - Now for repeat screening. N/A  History of Adenoma - Now for follow-up colonoscopy & has been > or = to 3 yrs.  No.  It has been less than 3 yrs since last colonoscopy.  Other: See Comments  Polyps Removed Today? No. Recommend repeat exam, <10 yrs? Yes.  High risk (family or personal hx). ASA CLASS:   Class II INDICATIONS:Patient's personal history of colon polyps; 2 cm polyp with high-grade dysplasia removed 2014 MEDICATIONS: MAC sedation, administered by CRNA and Propofol (Diprivan) 260 mg IV  DESCRIPTION OF PROCEDURE:   After the risks benefits and alternatives of the procedure were thoroughly explained, informed consent was obtained.  A digital rectal exam revealed no abnormalities of the rectum.   The [id number]  endoscope was introduced through the anus and advanced to the cecum, which was identified by both the appendix and ileocecal valve. No adverse events experienced.   The quality of the prep was excellent using Suprep  The instrument was then slowly withdrawn as the colon was fully examined.      COLON FINDINGS: A normal appearing cecum, ileocecal valve, and appendiceal orifice were identified.  The ascending, hepatic flexure, transverse, splenic flexure, descending, sigmoid colon and rectum appeared unremarkable.  No polyps or cancers were seen. Retroflexed views revealed no abnormalities. The time to cecum=5 minutes 32 seconds.  Withdrawal time=8 minutes 49 seconds.  The scope was withdrawn and the procedure completed. COMPLICATIONS:  There were no complications.  ENDOSCOPIC IMPRESSION: Normal colon  RECOMMENDATIONS: Colonoscopy 3 years   eSigned:  Inda Castle, MD 01/28/2014 8:35 AM   cc:   PATIENT NAME:  Thai, Hemrick MR#: [id number]

## 2014-01-31 ENCOUNTER — Telehealth: Payer: Self-pay

## 2014-01-31 NOTE — Telephone Encounter (Signed)
  Follow up Call-  Call back number 01/28/2014 12/11/2012  Post procedure Call Back phone  # [phone]  Permission to leave phone message Yes Yes     Patient questions:  Do you have a fever, pain , or abdominal swelling? No. Pain Score  0 *  Have you tolerated food without any problems? Yes.    Have you been able to return to your normal activities? Yes.    Do you have any questions about your discharge instructions: Diet   No. Medications  No. Follow up visit  No.  Do you have questions or concerns about your Care? No.  Actions: * If pain score is 4 or above: No action needed, pain <4.

## 2014-05-17 ENCOUNTER — Encounter: Payer: Self-pay | Admitting: Gynecology

## 2014-05-17 DIAGNOSIS — Z1231 Encounter for screening mammogram for malignant neoplasm of breast: Secondary | ICD-10-CM | POA: Diagnosis not present

## 2014-06-01 DIAGNOSIS — Z23 Encounter for immunization: Secondary | ICD-10-CM | POA: Diagnosis not present

## 2014-06-06 ENCOUNTER — Encounter: Payer: Self-pay | Admitting: Gastroenterology

## 2014-07-07 ENCOUNTER — Ambulatory Visit (INDEPENDENT_AMBULATORY_CARE_PROVIDER_SITE_OTHER): Payer: Medicare Other

## 2014-07-07 DIAGNOSIS — Z780 Asymptomatic menopausal state: Secondary | ICD-10-CM | POA: Diagnosis not present

## 2014-07-07 DIAGNOSIS — M8580 Other specified disorders of bone density and structure, unspecified site: Secondary | ICD-10-CM

## 2014-07-13 ENCOUNTER — Other Ambulatory Visit: Payer: Self-pay | Admitting: Gynecology

## 2014-07-13 DIAGNOSIS — M8580 Other specified disorders of bone density and structure, unspecified site: Secondary | ICD-10-CM

## 2014-08-25 ENCOUNTER — Encounter: Payer: Self-pay | Admitting: *Deleted

## 2014-08-30 ENCOUNTER — Other Ambulatory Visit: Payer: Self-pay | Admitting: Cardiovascular Disease

## 2014-08-30 MED ORDER — NEBIVOLOL HCL 5 MG PO TABS: ORAL_TABLET | ORAL | Status: DC

## 2014-08-30 NOTE — Telephone Encounter (Signed)
Pt need enough medicine until she has an appt with Dr Claiborne Billings on 09-07-14 please. Please call her Bystolic 5 mg #7 to [phone].

## 2014-08-30 NOTE — Telephone Encounter (Signed)
Medication refilled

## 2014-09-07 ENCOUNTER — Ambulatory Visit (INDEPENDENT_AMBULATORY_CARE_PROVIDER_SITE_OTHER): Payer: Medicare Other | Admitting: Cardiovascular Disease

## 2014-09-07 ENCOUNTER — Encounter: Payer: Self-pay | Admitting: Cardiovascular Disease

## 2014-09-07 VITALS — BP 112/72 | HR 48 | Ht 64.0 in | Wt 167.5 lb

## 2014-09-07 DIAGNOSIS — M8580 Other specified disorders of bone density and structure, unspecified site: Secondary | ICD-10-CM | POA: Diagnosis not present

## 2014-09-07 DIAGNOSIS — I499 Cardiac arrhythmia, unspecified: Secondary | ICD-10-CM | POA: Diagnosis not present

## 2014-09-07 DIAGNOSIS — E785 Hyperlipidemia, unspecified: Secondary | ICD-10-CM

## 2014-09-07 DIAGNOSIS — Z79899 Other long term (current) drug therapy: Secondary | ICD-10-CM

## 2014-09-07 DIAGNOSIS — R002 Palpitations: Secondary | ICD-10-CM | POA: Diagnosis not present

## 2014-09-07 DIAGNOSIS — R001 Bradycardia, unspecified: Secondary | ICD-10-CM | POA: Diagnosis not present

## 2014-09-07 DIAGNOSIS — I491 Atrial premature depolarization: Secondary | ICD-10-CM

## 2014-09-07 MED ORDER — NEBIVOLOL HCL 5 MG PO TABS: ORAL_TABLET | ORAL | Status: DC

## 2014-09-07 NOTE — Progress Notes (Signed)
Patient ID: Cynthia Rasmussen, female   DOB: Jan 08, 1946, 69 y.o.   MRN: [id number]     HPI: Cynthia Rasmussen is a 69 y.o. female who presents to the office today for a 7 month cardiology evaluation. 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 Initially she developed significant fatigue on Toprol but has  Tolerated low dose bystolic. A 2-D echo Doppler study in 2010 showed normal systolic function and possible early borderline diastolic dysfunction with an E/A ratio of 0.94. She had normal pulmonary pressures. She did have mild thickening of her mitral valve leaflets without definitive prolapse and had mild mitral regurgitation.  She is followed Dr. Uvaldo Rising. She does have a history of osteopenia.   She denies any chest pain or shortness of breath.  She denies any change in exercise capacity.  She denies fatigue with Bystolic and is unaware of any palpitations.  She has not had laboratory in a long time.  She presents for evaluation.  Past Medical History  Diagnosis Date  . Thrombophlebitis     WITH PREGNANCY  . Osteopenia   . APC (atrial premature contractions)     DR. Claiborne Billings  . Hx of echocardiogram 2010    Which showed normal systolic function as well as mitral valve E:A ratio of 0.94 with normal pulmonary artery pressures with RV pressure of 22. She did have evidence of mild thickening of her mitral valve leaflefts, and mitral valve prolapse cannot be completely excluded. She did have mild mitral regurgitation. There also was moderate tricuspid regurgitation.    Past Surgical History  Procedure Laterality Date  . 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 Tubal ligation  1985  . Wisdom tooth extraction  1965  . Tonsillectomy and adenoidectomy      No Known Allergies  Current Outpatient Prescriptions  Medication Sig Dispense Refill  . Calcium-Vitamin  D-Vitamin K (VIACTIV PO) Take by mouth.      . Docosahexaenoic Acid (DHA COMPLETE PO) Take 30 mg by mouth daily.    . Multiple Vitamin (MULTIVITAMIN) tablet Take 1 tablet by mouth daily.    . nebivolol (BYSTOLIC) 5 MG tablet Take 1/2 -1 tablet daily 7 tablet 0  . psyllium (REGULOID) 0.52 G capsule Take 0.52 g by mouth daily.     No current facility-administered medications for this visit.    History   Social History  . Marital Status: Married    Spouse Name: N/A    Number of Children: N/A  . Years of Education: N/A   Occupational History  . Not on file.   Social History Main Topics  . Smoking status: Former Smoker    Types: Cigarettes    Quit date: 11/27/1975  . Smokeless tobacco: Never Used  . Alcohol Use: 1.2 - 2.4 oz/week    2-4 Glasses of wine per week  . Drug Use: No  . Sexual Activity:    Partners: Male   Other Topics Concern  . Not on file   Social History Narrative    Family History  Problem Relation Age of Onset  . Osteoporosis Mother   . Hypertension Father   . Heart disease Father   . Hypertension Sister   . Colon cancer Neg Hx   . Cancer Maternal Uncle     LIVER   Additional social  history is notable that she is originally from outside of [place] area. She has 3 children ages 22 and 83, one child lives in [place], [place] and the others live in [place]. 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 She completed college. His remote tobacco history for 10 years but she quit in 1977. She does drink occasional wine.   ROS General: Negative; No fevers, chills, or night sweats;  HEENT: Negative; No changes in vision or hearing, sinus congestion, difficulty swallowing Pulmonary: Negative; No cough, wheezing, shortness of breath, hemoptysis Cardiovascular: Negative; No chest pain, presyncope, syncope, palpitations GI: Negative; No nausea,  vomiting, diarrhea, or abdominal pain GU: Negative; No dysuria, hematuria, or difficulty voiding Musculoskeletal: Negative; no myalgias, joint pain, or weakness Hematologic/Oncology: Negative; no easy bruising, bleeding Endocrine: Negative; no heat/cold intolerance; no diabetes Neuro: Negative; no changes in balance, headaches Skin: Negative; No rashes or skin lesions Psychiatric: Negative; No behavioral problems, depression Sleep: Negative; No snoring, daytime sleepiness, hypersomnolence, bruxism, restless legs, hypnogognic hallucinations, no cataplexy Other comprehensive 14 point system review is negative.   PE BP 112/72 mmHg  Pulse 48  Ht 5\' 4"  (1.626 m)  Wt 167 lb 8 oz (75.978 kg)  BMI 28.74 kg/m2  General: Alert, oriented, no distress.  Skin: normal turgor, no rashes HEENT: Normocephalic, atraumatic. Pupils round and reactive; sclera anicteric;no lid lag.  Nose without nasal septal hypertrophy Mouth/Parynx benign; Mallinpatti scale 2 Neck: No JVD, no carotid bruits normal carotid upstroke Lungs: clear to ausculatation and percussion; no wheezing or rales Chest wall: Nontender to palpation Heart: RRR, s1 s2 normal 1/6 systolic murmur compatible with mild mitral regurgitation. I did not hear any systolic clicks.  No diastolic murmur, no rubs thrills or heaves Abdomen: soft, nontender; no hepatosplenomehaly, BS+; abdominal aorta nontender and not dilated by palpation. Pulses 2+ Back: No CVA tenderness Extremities: no clubbing cyanosis or edema, Homan's sign negative  Neurologic: grossly nonfocal Psychologic: normal affect and mood.  ECG (independently read by me): Sinus bradycardia at 48 bpm.  Normal intervals.  ECG: Sinus rhythm at 55 beats per minute. Intervals are normal.  LABS:  BMET    Component Value Date/Time   GLUCOSE 79 05/02/2011 1216     Hepatic Function Panel  No results found for: PROT   CBC    Component Value Date/Time   WBC 7.2 09/09/2012 1504    RBC 4.57 09/09/2012 1504   HGB 14.0 09/09/2012 1504   HCT 40.5 09/09/2012 1504   PLT 318 09/09/2012 1504   MCV 88.6 09/09/2012 1504   MCH 30.6 09/09/2012 1504   MCHC 34.6 09/09/2012 1504   RDW 13.8 09/09/2012 1504   LYMPHSABS 2.0 09/09/2012 1504   MONOABS 0.5 09/09/2012 1504   EOSABS 0.1 09/09/2012 1504   BASOSABS 0.0 09/09/2012 1504     BNP No results found for: PROBNP  Lipid Panel     Component Value Date/Time   CHOL 173 09/09/2012 1504   TRIG 101 05/02/2011 1120   HDL 49 05/02/2011 1120   CHOLHDL 3.8 05/02/2011 1120   VLDL 20 05/02/2011 1120   LDLCALC 117* 05/02/2011 1120     RADIOLOGY: No results found.    ASSESSMENT AND PLAN:   Cynthia Rasmussen is doing well. She is tolerating low-dose Bystolic without side effects. She feels that this has completely controlled her palpitations. She does have osteopenia, but not osteoporosis.  Her rhythm reveals sinus bradycardia.  She  is completely asymptomatic and denies any dizziness.  She feels the low-dose Bystolic has controlled her palpitations.  She has not had recent laboratory.  I will check a CBC, chemistry profile, TSH, fasting lipid panel, and with her history of osteopenia. I will also check vitamin D level.  We will contact her regarding the results if adjustments need to be made.  As long as she remains stable, I will see her in 18 months for reevaluation or sooner if problems arise.   Troy Sine, MD, [facility]  09/07/2014 5:06 PM

## 2014-09-07 NOTE — Patient Instructions (Signed)
Your physician recommends that you return for lab work fasting.  Your physician wants you to follow-up in: 18 months or sooner if needed/ You will receive a reminder letter in the mail two months in advance. If you don't receive a letter, please call our office to schedule the follow-up appointment.

## 2014-09-09 DIAGNOSIS — Z79899 Other long term (current) drug therapy: Secondary | ICD-10-CM | POA: Diagnosis not present

## 2014-09-09 DIAGNOSIS — E785 Hyperlipidemia, unspecified: Secondary | ICD-10-CM | POA: Diagnosis not present

## 2014-09-09 DIAGNOSIS — M8580 Other specified disorders of bone density and structure, unspecified site: Secondary | ICD-10-CM | POA: Diagnosis not present

## 2014-09-09 LAB — LIPID PANEL
CHOL/HDL RATIO: 3.6 ratio
Cholesterol: 192 mg/dL (ref 0–200)
HDL: 53 mg/dL (ref >39)
LDL CALC: 116 mg/dL — AB (ref 0–99)
TRIGLYCERIDES: 116 mg/dL (ref <150)
VLDL: 23 mg/dL (ref 0–40)

## 2014-09-09 LAB — CBC
HCT: 40.8 % (ref 36.0–46.0)
HEMOGLOBIN: 13.9 g/dL (ref 12.0–15.0)
MCH: 31.2 pg (ref 26.0–34.0)
MCHC: 34.1 g/dL (ref 30.0–36.0)
MCV: 91.5 fL (ref 78.0–100.0)
MPV: 9.1 fL (ref 8.6–12.4)
PLATELETS: 282 10*3/uL (ref 150–400)
RBC: 4.46 MIL/uL (ref 3.87–5.11)
RDW: 13.5 % (ref 11.5–15.5)
WBC: 5.8 10*3/uL (ref 4.0–10.5)

## 2014-09-09 LAB — COMPREHENSIVE METABOLIC PANEL
ALK PHOS: 70 U/L (ref 39–117)
ALT: 17 U/L (ref 0–35)
AST: 21 U/L (ref 0–37)
Albumin: 4.2 g/dL (ref 3.5–5.2)
BUN: 21 mg/dL (ref 6–23)
CALCIUM: 9.1 mg/dL (ref 8.4–10.5)
CO2: 26 mEq/L (ref 19–32)
Chloride: 105 mEq/L (ref 96–112)
Creat: 0.92 mg/dL (ref 0.50–1.10)
Glucose, Bld: 89 mg/dL (ref 70–99)
Potassium: 4.3 mEq/L (ref 3.5–5.3)
Sodium: 142 mEq/L (ref 135–145)
Total Bilirubin: 0.6 mg/dL (ref 0.2–1.2)
Total Protein: 6.8 g/dL (ref 6.0–8.3)

## 2014-09-09 LAB — TSH: TSH: 2.836 u[IU]/mL (ref 0.350–4.500)

## 2014-09-11 LAB — VITAMIN D 1,25 DIHYDROXY
VITAMIN D 1, 25 (OH) TOTAL: 42 pg/mL (ref 18–72)
Vitamin D2 1, 25 (OH)2: <8 pg/mL
Vitamin D3 1, 25 (OH)2: 42 pg/mL

## 2015-01-30 ENCOUNTER — Other Ambulatory Visit: Payer: Self-pay

## 2015-04-08 DIAGNOSIS — H4310 Vitreous hemorrhage, unspecified eye: Secondary | ICD-10-CM | POA: Diagnosis not present

## 2015-04-08 DIAGNOSIS — H2513 Age-related nuclear cataract, bilateral: Secondary | ICD-10-CM | POA: Diagnosis not present

## 2015-04-08 DIAGNOSIS — H539 Unspecified visual disturbance: Secondary | ICD-10-CM | POA: Diagnosis not present

## 2015-04-08 DIAGNOSIS — H43819 Vitreous degeneration, unspecified eye: Secondary | ICD-10-CM | POA: Diagnosis not present

## 2015-04-11 DIAGNOSIS — H35362 Drusen (degenerative) of macula, left eye: Secondary | ICD-10-CM | POA: Diagnosis not present

## 2015-04-11 DIAGNOSIS — H4311 Vitreous hemorrhage, right eye: Secondary | ICD-10-CM | POA: Diagnosis not present

## 2015-04-11 DIAGNOSIS — H43391 Other vitreous opacities, right eye: Secondary | ICD-10-CM | POA: Diagnosis not present

## 2015-04-11 DIAGNOSIS — H43811 Vitreous degeneration, right eye: Secondary | ICD-10-CM | POA: Diagnosis not present

## 2015-05-01 DIAGNOSIS — Z23 Encounter for immunization: Secondary | ICD-10-CM | POA: Diagnosis not present

## 2015-05-24 DIAGNOSIS — H43391 Other vitreous opacities, right eye: Secondary | ICD-10-CM | POA: Diagnosis not present

## 2015-05-24 DIAGNOSIS — H43811 Vitreous degeneration, right eye: Secondary | ICD-10-CM | POA: Diagnosis not present

## 2015-05-24 DIAGNOSIS — H35362 Drusen (degenerative) of macula, left eye: Secondary | ICD-10-CM | POA: Diagnosis not present

## 2015-05-25 DIAGNOSIS — D2371 Other benign neoplasm of skin of right lower limb, including hip: Secondary | ICD-10-CM | POA: Diagnosis not present

## 2015-05-25 DIAGNOSIS — M79609 Pain in unspecified limb: Secondary | ICD-10-CM | POA: Diagnosis not present

## 2015-05-26 DIAGNOSIS — Z1231 Encounter for screening mammogram for malignant neoplasm of breast: Secondary | ICD-10-CM | POA: Diagnosis not present

## 2015-05-29 ENCOUNTER — Encounter: Payer: Self-pay | Admitting: Gynecology

## 2015-06-02 DIAGNOSIS — H43811 Vitreous degeneration, right eye: Secondary | ICD-10-CM | POA: Diagnosis not present

## 2015-06-02 DIAGNOSIS — H2513 Age-related nuclear cataract, bilateral: Secondary | ICD-10-CM | POA: Diagnosis not present

## 2015-06-02 DIAGNOSIS — H25013 Cortical age-related cataract, bilateral: Secondary | ICD-10-CM | POA: Diagnosis not present

## 2015-06-02 DIAGNOSIS — H35371 Puckering of macula, right eye: Secondary | ICD-10-CM | POA: Diagnosis not present

## 2016-02-08 ENCOUNTER — Ambulatory Visit (INDEPENDENT_AMBULATORY_CARE_PROVIDER_SITE_OTHER): Payer: Medicare Other | Admitting: Gynecology

## 2016-02-08 ENCOUNTER — Encounter: Payer: Self-pay | Admitting: Gynecology

## 2016-02-08 VITALS — BP 112/70 | Ht 64.75 in | Wt 166.8 lb

## 2016-02-08 DIAGNOSIS — Z01419 Encounter for gynecological examination (general) (routine) without abnormal findings: Secondary | ICD-10-CM | POA: Diagnosis not present

## 2016-02-08 DIAGNOSIS — N393 Stress incontinence (female) (male): Secondary | ICD-10-CM

## 2016-02-08 DIAGNOSIS — M8580 Other specified disorders of bone density and structure, unspecified site: Secondary | ICD-10-CM | POA: Diagnosis not present

## 2016-02-08 DIAGNOSIS — M25552 Pain in left hip: Secondary | ICD-10-CM | POA: Diagnosis not present

## 2016-02-08 NOTE — Patient Instructions (Addendum)
Urodynamic Testing WHAT IS URODYNAMIC TESTING? Urodynamic tests are done to determine how well your lower urinary tract is working. The lower urinary tract includes your bladder and the tube that empties your bladder (urethra).  When your kidneys filter your blood, urine is stored in your bladder until you feel the urge to pass urine (urinate). Urination requires coordination between the nerves and muscles of your bladder and urethra. When your lower urinary tract is working well, you should be able to:  1. Start urinating when your bladder is full. 2. Empty your bladder completely. 3. Control the flow of your urine. WHY DO I NEED URODYNAMIC TESTING? You may need urodynamic testing if you:  Are leaking urine (incontinence).  Have problems starting or stopping your urine flow.  Have frequent or painful urination.  Have frequent urinary tract infections.  Cannot empty your bladder completely.  Have strong urges to pass urine (urgency).  Have a weak flow of urine. HOW IS URODYNAMIC TESTING DONE? Urodynamic tests may be done separately or all during one testing visit. These tests may be done at your health care provider's office, a clinic, or a hospital. You may be given an antibiotic medicine before or after testing to prevent infection. Ask your health care provider if you should:  Stop taking any of your regular medicines.  Arrive for the test with a full bladder. The urodynamic tests you may have done include: Uroflowmetry This test measures how much urine you pass and how long it takes to pass.  You sit on a special toilet to urinate.  The toilet measures the volume and the time of your urine flow.  These measurements are sent to a computer that creates a graph of your urine flow. Postvoid residual measurement This test measures how much urine is left in your bladder after you urinate.  It uses sound waves (ultrasound) to create an image of your bladder.  The test can also  be done by inserting a thin, flexible tube (catheter) into your bladder after you urinate.  Remaining urine is measured in milliliters (mL). If you have more than 100 mL left in your bladder after you urinate, your bladder is not emptying as it should. Cystometric testing This test uses a special bladder catheter that can measure pressure.   A numbing medicine (local anesthetic) may be used.  First, a normal catheter is used to empty your bladder completely.  Then the measuring catheter is placed, and your bladder is filled with warm, germ-free (sterile) water.  Pressure measurements will be taken:  As your bladder fills.  When you feel the need to urinate.  As your bladder is emptied.  You may be asked to cough or bear down to check for leakage.  In some cases, your bladder may be filled with a material that shows up on X-rays (contrast material) so that X-ray pictures can be taken during the test. Electromyography This test measures the electrical activity of the nerves and muscles of your bladder and the opening of your urethra.   It tells how well your nerves are communicating with your muscles.  Sticky patches are placed near your rectum and urethra to measure electrical activity. WHAT ARE THE RISKS OF THIS TESTING? Generally, these tests are safe. However, problems can occur and include:  Discomfort.  Frequent urge to urinate.  Bleeding.  Infection.  An allergic reaction to contrast material, if contrast material is used. WHAT HAPPENS AFTER THE TESTING?   You should be able to  go home right away and do your usual activities.  You may be instructed to drink a tall glass of water every 30 minutes for the first 2 hours you are home.  Taking a warm bath or using a warm compress may relieve any discomfort near your urethra. Let your health care provider know if you have:  Pain.  Blood in your urine.  Chills.  Fever. WHAT DO MY TEST RESULTS MEAN? Discuss the  results of your urodynamic tests with your health care provider. Your health care provider will use the results of these and other tests, along with your signs and symptoms, to make a diagnosis. Some common causes for abnormal results from urodynamic tests include:  Enlarged prostate in men.  Overactive bladder.  Urinary tract infection.  Nervous system diseases.  Spinal cord damage.   This information is not intended to replace advice given to you by your health care provider. Make sure you discuss any questions you have with your health care provider.   Document Released: 05/19/2007 Document Revised: 08/12/2014 Document Reviewed: 11/01/2013 Elsevier Interactive Patient Education 2016 [employer] Urinary Incontinence Urinary incontinence is the involuntary loss of urine from your bladder. CAUSES  There are many causes of urinary incontinence. They include: 4. Medicines. 5. Infections. 6. Prostatic enlargement, leading to overflow of urine from your bladder. 7. Surgery. 8. Neurological diseases. 9. Emotional factors. SIGNS AND SYMPTOMS Urinary Incontinence can be divided into four types:  Urge incontinence. Urge incontinence is the involuntary loss of urine before you have the opportunity to go to the bathroom. There is a sudden urge to void but not enough time to reach a bathroom.  Stress incontinence. Stress incontinence is the sudden loss of urine with any activity that forces urine to pass. It is commonly caused by anatomical changes to the pelvis and sphincter areas of your body.  Overflow incontinence. Overflow incontinence is the loss of urine from an obstructed opening to your bladder. This results in a backup of urine and a resultant buildup of pressure within the bladder. When the pressure within the bladder exceeds the closing pressure of the sphincter, the urine overflows, which causes incontinence, similar to water overflowing a dam.  Total incontinence. Total  incontinence is the loss of urine as a result of the inability to store urine within your bladder. DIAGNOSIS  Evaluating the cause of incontinence may require:  A thorough and complete medical and obstetric history.  A complete physical exam.  Laboratory tests such as a urine culture and sensitivities. When additional tests are indicated, they can include:  An ultrasound exam.  Kidney and bladder X-rays.  Cystoscopy. This is an exam of the bladder using a narrow scope.  Urodynamic testing to test the nerve function to the bladder and sphincter areas. TREATMENT  Treatment for urinary incontinence depends on the cause:  For urge incontinence caused by a bacterial infection, antibiotics will be prescribed. If the urge incontinence is related to medicines you take, your health care provider may have you change the medicine.  For stress incontinence, surgery to re-establish anatomical support to the bladder or sphincter, or both, will often correct the condition.  For overflow incontinence caused by an enlarged prostate, an operation to open the channel through the enlarged prostate will allow the flow of urine out of the bladder. In women with fibroids, a hysterectomy may be recommended.  For total incontinence, surgery on your urinary sphincter may help. An artificial urinary sphincter (an inflatable cuff placed around the  urethra) may be required. In women who have developed a hole-like passage between their bladder and vagina (vesicovaginal fistula), surgery to close the fistula often is required. HOME CARE INSTRUCTIONS  Normal daily hygiene and the use of pads or adult diapers that are changed regularly will help prevent odors and skin damage.  Avoid caffeine. It can overstimulate your bladder.  Use the bathroom regularly. Try about every 2-3 hours to go to the bathroom, even if you do not feel the need to do so. Take time to empty your bladder completely. After urinating, wait a  minute. Then try to urinate again.  For causes involving nerve dysfunction, keep a log of the medicines you take and a journal of the times you go to the bathroom. SEEK MEDICAL CARE IF:  You experience worsening of pain instead of improvement in pain after your procedure.  Your incontinence becomes worse instead of better. SEE IMMEDIATE MEDICAL CARE IF:  You experience fever or shaking chills.  You are unable to pass your urine.  You have redness spreading into your groin or down into your thighs. MAKE SURE YOU:   Understand these instructions.   Will watch your condition.  Will get help right away if you are not doing well or get worse.   This information is not intended to replace advice given to you by your health care provider. Make sure you discuss any questions you have with your health care provider.   Document Released: 08/29/2004 Document Revised: 08/12/2014 Document Reviewed: 12/29/2012 Elsevier Interactive Patient Education 2016 [employer].  Kegel Exercises The goal of Kegel exercises is to isolate and exercise your pelvic floor muscles. These muscles act as a hammock that supports the rectum, vagina, small intestine, and uterus. As the muscles weaken, the hammock sags and these organs are displaced from their normal positions. Kegel exercises can strengthen your pelvic floor muscles and help you to improve bladder and bowel control, improve sexual response, and help reduce many problems and some discomfort during pregnancy. Kegel exercises can be done anywhere and at any time. HOW TO PERFORM KEGEL EXERCISES 10. Locate your pelvic floor muscles. To do this, squeeze (contract) the muscles that you use when you try to stop the flow of urine. You will feel a tightness in the vaginal area (women) and a tight lift in the rectal area (men and women). 11. When you begin, contract your pelvic muscles tight for 2-5 seconds, then relax them for 2-5 seconds. This is one set. Do  4-5 sets with a short pause in between. 12. Contract your pelvic muscles for 8-10 seconds, then relax them for 8-10 seconds. Do 4-5 sets. If you cannot contract your pelvic muscles for 8-10 seconds, try 5-7 seconds and work your way up to 8-10 seconds. Your goal is 4-5 sets of 10 contractions each day. Keep your stomach, buttocks, and legs relaxed during the exercises. Perform sets of both short and long contractions. Vary your positions. Perform these contractions 3-4 times per day. Perform sets while you are:   Lying in bed in the morning.  Standing at lunch.  Sitting in the late afternoon.  Lying in bed at night. You should do 40-50 contractions per day. Do not perform more Kegel exercises per day than recommended. Overexercising can cause muscle fatigue. Continue these exercises for for at least 15-20 weeks or as directed by your caregiver.   This information is not intended to replace advice given to you by your health care provider. Make sure  you discuss any questions you have with your health care provider.   Document Released: 07/08/2012 Document Revised: 08/12/2014 Document Reviewed: 07/08/2012 Elsevier Interactive Patient Education [employer].

## 2016-02-08 NOTE — Progress Notes (Signed)
Cynthia Rasmussen May 08, 1946 [id number]   History:    70 y.o.  for annual gyn exam with several complaints today. One of her complaints is been since she started exercising more regularly since she retired she started noticing some thigh muscle straining discomfort. Also she has started to notice worsening stress urinary incontinence during the day that she has to wear a pad all the time with her with minimal exercise or walking she leaks urine. She does not have any urgency or frequency or any nocturia. Patient's last colonoscopy was in 2015 she had several polyps removed and she is on a 3 year recall. She had a bone density study in 2015 lowest T score was at the AP spine with a -2.2 and right femoral neck of -1.1 with normal Frax analysis. There was statistically significant decrease in bone mineralization at the AP spine and the left hip. She is currently taking calcium and vitamin D. She has been followed by her cardiologist Dr. Claiborne Billings is a result of her mitral valve regurgitation and sinus bradycardia history. Her PCP is Dr. Aline Brochure has been doing her blood work. Patient's vaccine including shingles are up-to-date. Patient on normal replacement therapy.  Past medical history,surgical history, family history and social history were all reviewed and documented in the EPIC chart.  Gynecologic History No LMP recorded. Patient is postmenopausal. Contraception: post menopausal status Last Pap: 2015. Results were: normal Last mammogram: 2016. Results were: normal  Obstetric History OB History  Gravida Para Term Preterm AB SAB TAB Ectopic Multiple Living  3 3        3     # Outcome Date GA Lbr Len/2nd Weight Sex Delivery Anes PTL Lv  3 Para           2 Para           1 Para                ROS: A ROS was performed and pertinent positives and negatives are included in the history.  GENERAL: No fevers or chills. HEENT: No change in vision, no earache, sore throat or sinus congestion. NECK: No  pain or stiffness. CARDIOVASCULAR: No chest pain or pressure. No palpitations. PULMONARY: No shortness of breath, cough or wheeze. GASTROINTESTINAL: No abdominal pain, nausea, vomiting or diarrhea, melena or bright red blood per rectum. GENITOURINARY: No urinary frequency, urgency, hesitancy or dysuria. MUSCULOSKELETAL: No joint or muscle pain, no back pain, no recent trauma. DERMATOLOGIC: No rash, no itching, no lesions. ENDOCRINE: No polyuria, polydipsia, no heat or cold intolerance. No recent change in weight. HEMATOLOGICAL: No anemia or easy bruising or bleeding. NEUROLOGIC: No headache, seizures, numbness, tingling or weakness. PSYCHIATRIC: No depression, no loss of interest in normal activity or change in sleep pattern.     Exam: chaperone present  BP 112/70 mmHg  Ht 5' 4.75" (1.645 m)  Wt 166 lb 12.8 oz (75.66 kg)  BMI 27.96 kg/m2  Body mass index is 27.96 kg/(m^2).  General appearance : Well developed well nourished female. No acute distress HEENT: Eyes: no retinal hemorrhage or exudates,  Neck supple, trachea midline, no carotid bruits, no thyroidmegaly Lungs: Clear to auscultation, no rhonchi or wheezes, or rib retractions  Heart: Regular rate and rhythm, no murmurs or gallops Breast:Examined in sitting and supine position were symmetrical in appearance, no palpable masses or tenderness,  no skin retraction, no nipple inversion, no nipple discharge, no skin discoloration, no axillary or supraclavicular lymphadenopathy Abdomen: no palpable masses or  tenderness, no rebound or guarding Extremities: no edema or skin discoloration or tenderness  Pelvic:  Bartholin, Urethra, Skene Glands: Within normal limits             Vagina: No gross lesions or discharge, atrophic changes  Cervix: No gross lesions or discharge  Uterus  anteverted, normal size, shape and consistency, non-tender and mobile  Adnexa  Without masses or tenderness  Anus and perineum  normal   Rectovaginal  normal  sphincter tone without palpated masses or tenderness             Hemoccult PCP provides   Because of her history of stress urinary incontinence the following tests were done today in the office: A Q-tip was applied with lidocaine gel in the urethra vesicle region and with Valsalva the urethrovesical angle was less than 30. There was a mild first-degree cystocele and a mild first-degree rectocele. There was no pelvic organ prolapse. She had a good anal wink. On Valsalva in the supine and erect position she did not leak any urine.  Left lower extremity flexion-extension with no discomfort.  Assessment/Plan:  70 y.o. female for annual exam will appears to have a mild musculoskeletal strain. She was encouraged to warm up before her regular exercise routine. She can take over-the-counter ibuprofen when necessary. We discussed importance of calcium vitamin D for osteoporosis prevention. She is due for her next bone density study December this year. She is due for her mammogram in October this year. I provided her with literature information on Kegel exercises and had recommended to do urodynamic testing here in the office and she will like to try the exercises first. Her PCP and cardiologist been doing her blood work.   Terrance Mass MD, 11:53 AM 02/08/2016

## 2016-02-12 ENCOUNTER — Telehealth: Payer: Self-pay | Admitting: Cardiovascular Disease

## 2016-02-12 NOTE — Telephone Encounter (Signed)
New message  Patient calling the office for samples of medication:   1.  What medication and dosage are you requesting samples for? Bystolic, 5mg   2.  Are you currently out of this medication? Running out this week before appt on

## 2016-02-12 NOTE — Telephone Encounter (Signed)
Returned call to patient. Medication Samples have been left at front desk for patient to pick up.  Drug name: Bystolic       Strength: 5mg         Qty: 4 boxes (28 pills)  LOT: [id number]  Exp.Date: 11/18  Dosing instructions: Take 1/2 to 1 tablet daily.  The patient has been instructed regarding the correct time, dose, and frequency of taking this medication, including desired effects and most common side effects.   Roney Jaffe 2:06 PM 02/12/2016

## 2016-03-04 ENCOUNTER — Ambulatory Visit (INDEPENDENT_AMBULATORY_CARE_PROVIDER_SITE_OTHER): Payer: Medicare Other | Admitting: Physician Assistant

## 2016-03-04 ENCOUNTER — Encounter: Payer: Self-pay | Admitting: Physician Assistant

## 2016-03-04 VITALS — BP 100/60 | HR 52 | Ht 64.0 in | Wt 167.0 lb

## 2016-03-04 DIAGNOSIS — R002 Palpitations: Secondary | ICD-10-CM

## 2016-03-04 MED ORDER — NEBIVOLOL HCL 5 MG PO TABS: ORAL_TABLET | ORAL | 11 refills | Status: DC

## 2016-03-04 NOTE — Patient Instructions (Signed)
Your physician wants you to follow-up in: 1 year with Dr. Kelly. You will receive a reminder letter in the mail two months in advance. If you don't receive a letter, please call our office to schedule the follow-up appointment.  

## 2016-03-04 NOTE — Progress Notes (Signed)
Cardiology Office Note   Date:  03/04/2016   ID:  Cynthia, Rasmussen 1946/03/12, MRN [id number]  PCP:  Shirline Frees, MD  Cardiologist:  Dr Meredeth Ide, PA-C   Chief Complaint  Patient presents with  . Follow-up    18 months (med refills)  . Shortness of Breath    History of Present Illness: Cynthia Rasmussen is a 70 y.o. 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  Cynthia Rasmussen presents for Follow-up of her palpitations and PACs  She has been doing extremely well on the by systolic. She is aware that her heart rate is on the low side and her blood pressure is very well controlled, but she is only taking a very low dose of the diastolic number. As her palpitations are well controlled and she is not having chest pain or shortness of breath, she wishes to stay on the same medication. The cavity is that this medication is very expensive. It is not at all covered by her insurance. Her Price over $130 a month.  She never gets chest pain. She is exercising regularly feels that she is healthy. She is not having any problems with being lightheaded, dizzy, short of breath with exertion. Her weight is stable. Her blood pressure in the doctor's office recently was 123456 systolic. She has a FitBit and sees her heart rate go up into the 80s when she is exerting herself. She was watching her heart rate at rest, and seeing it in the 50s. However because she was asymptomatic, she gradually became less and less concerned about it.  She has varicose veins that are significant especially on her right leg, but she gets no pain or discomfort from them. She has no lower extremity edema.   Past Medical History:  Diagnosis Date  . APC (atrial premature contractions)    DR. Claiborne Billings  . Hx of echocardiogram 2010   Which showed normal systolic function as well as mitral valve E:A ratio  of 0.94 with normal pulmonary artery pressures with RV pressure of 22. She did have evidence of mild thickening of her mitral valve leaflefts, and mitral valve prolapse cannot be completely excluded. She did have mild mitral regurgitation. There also was moderate tricuspid regurgitation.  . Osteopenia   . Thrombophlebitis    WITH PREGNANCY    Past Surgical History:  Procedure Laterality Date  . LEG SURGERY  1964   BENIGN BONE TUMOR; right leg  . TONSILLECTOMY AND ADENOIDECTOMY    . TUBAL LIGATION  1985  . WISDOM TOOTH EXTRACTION  1965    Current Outpatient Prescriptions  Medication Sig Dispense Refill  . Docosahexaenoic Acid (DHA COMPLETE PO) Take 30 mg by mouth daily.    . nebivolol (BYSTOLIC) 5 MG tablet Take 1/2 -1 tablet daily 30 tablet 11  . psyllium (REGULOID) 0.52 G capsule Take 0.52 g by mouth daily.     No current facility-administered medications for this visit.     Allergies:   Review of patient's allergies indicates no known allergies.    Social History:  The patient  reports that she quit smoking about 40 years ago. Her smoking use included Cigarettes. She has never used smokeless tobacco. She reports that she drinks about 1.2 - 2.4 oz of alcohol per week . She reports that she does not use drugs.  Family History:  The patient's family history includes Cancer in her maternal uncle; Heart disease in her father; Hypertension in her father and sister; Osteoporosis in her mother.    ROS:  Please see the history of present illness. All other systems are reviewed and negative.    PHYSICAL EXAM: VS:  BP 100/60   Pulse (!) 52   Ht 5\' 4"  (1.626 m)   Wt 167 lb (75.8 kg)   BMI 28.67 kg/m  , BMI Body mass index is 28.67 kg/m. GEN: Well nourished, well developed, female in no acute distress  HEENT: normal for age  Neck: no JVD, no carotid bruit, no masses Cardiac: RRR; soft murmur, no rubs, or gallops Respiratory:  clear to auscultation bilaterally, normal work of  breathing GI: soft, nontender, nondistended, + BS MS: no deformity or atrophy; no edema; distal pulses are 2+ in all 4 extremities   Skin: warm and dry, no rash Neuro:  Strength and sensation are intact Psych: euthymic mood, full affect   EKG:  EKG is ordered today. The ekg ordered today demonstrates sinus bradycardia, rate 52, no acute ischemic changes   Recent Labs: No results found for requested labs within last 8760 hours.    Lipid Panel    Component Value Date/Time   CHOL 192 09/09/2014 0820   TRIG 116 09/09/2014 0820   HDL 53 09/09/2014 0820   CHOLHDL 3.6 09/09/2014 0820   VLDL 23 09/09/2014 0820   LDLCALC 116 (H) 09/09/2014 0820     Wt Readings from Last 3 Encounters:  03/04/16 167 lb (75.8 kg)  02/08/16 166 lb 12.8 oz (75.7 kg)  09/07/14 167 lb 8 oz (76 kg)     Other studies Reviewed: Additional studies/ records that were reviewed today include: Office notes and previous testing.  ASSESSMENT AND PLAN:  1.  PACs: Her arrhythmia as well-controlled on the Bystolic. Continue his medication. She was given information regarding possible assistance from the manufacturer on the cost. She does not wish to try other beta blockers and will continue taking this.  2. Bradycardia: It is sinus bradycardia and she is asymptomatic. By her reports, she does not have chronotropic incompetence. No med changes or further evaluation or needed.   Current medicines are reviewed at length with the patient today.  The patient does not have concerns regarding medicines.  The following changes have been made:  no change  Labs/ tests ordered today include: ECG   Disposition:   FU with Dr. Claiborne Billings in one year  Signed, Lenoard Aden  03/04/2016 11:26 AM    [place] Phone: [phone]; Fax: [phone]  This note was written with the assistance of speech recognition software. Please excuse any transcriptional errors.

## 2016-05-07 DIAGNOSIS — Z23 Encounter for immunization: Secondary | ICD-10-CM | POA: Diagnosis not present

## 2016-05-29 ENCOUNTER — Encounter: Payer: Self-pay | Admitting: Gynecology

## 2016-05-29 DIAGNOSIS — Z1231 Encounter for screening mammogram for malignant neoplasm of breast: Secondary | ICD-10-CM | POA: Diagnosis not present

## 2016-06-21 DIAGNOSIS — H35371 Puckering of macula, right eye: Secondary | ICD-10-CM | POA: Diagnosis not present

## 2016-06-21 DIAGNOSIS — H43811 Vitreous degeneration, right eye: Secondary | ICD-10-CM | POA: Diagnosis not present

## 2016-06-21 DIAGNOSIS — H25013 Cortical age-related cataract, bilateral: Secondary | ICD-10-CM | POA: Diagnosis not present

## 2016-06-21 DIAGNOSIS — H2513 Age-related nuclear cataract, bilateral: Secondary | ICD-10-CM | POA: Diagnosis not present

## 2016-07-23 ENCOUNTER — Ambulatory Visit (INDEPENDENT_AMBULATORY_CARE_PROVIDER_SITE_OTHER): Payer: Medicare Other

## 2016-07-23 ENCOUNTER — Encounter: Payer: Self-pay | Admitting: Gynecology

## 2016-07-23 ENCOUNTER — Other Ambulatory Visit: Payer: Self-pay | Admitting: Gynecology

## 2016-07-23 DIAGNOSIS — M8580 Other specified disorders of bone density and structure, unspecified site: Secondary | ICD-10-CM

## 2016-07-23 DIAGNOSIS — M899 Disorder of bone, unspecified: Secondary | ICD-10-CM

## 2016-07-25 ENCOUNTER — Encounter: Payer: Self-pay | Admitting: Gynecology

## 2016-07-30 DIAGNOSIS — M8580 Other specified disorders of bone density and structure, unspecified site: Secondary | ICD-10-CM | POA: Diagnosis not present

## 2016-07-30 DIAGNOSIS — Z1159 Encounter for screening for other viral diseases: Secondary | ICD-10-CM | POA: Diagnosis not present

## 2016-11-09 LAB — POCT LIPID PANEL
HDL: 52
LDL: 100
TC: 180
TRG: 141

## 2016-11-09 LAB — GLUCOSE, POCT (MANUAL RESULT ENTRY): POC GLUCOSE: 100 mg/dL — AB (ref 70–99)

## 2016-12-18 ENCOUNTER — Encounter: Payer: Self-pay | Admitting: Gynecology

## 2016-12-27 ENCOUNTER — Encounter: Payer: Self-pay | Admitting: Gastroenterology

## 2017-02-11 ENCOUNTER — Ambulatory Visit (INDEPENDENT_AMBULATORY_CARE_PROVIDER_SITE_OTHER): Payer: Medicare Other | Admitting: Gynecology

## 2017-02-11 ENCOUNTER — Encounter: Payer: Self-pay | Admitting: Gynecology

## 2017-02-11 ENCOUNTER — Encounter: Payer: Medicare Other | Admitting: Gynecology

## 2017-02-11 VITALS — BP 110/80 | Ht 64.0 in | Wt 167.0 lb

## 2017-02-11 DIAGNOSIS — Z01419 Encounter for gynecological examination (general) (routine) without abnormal findings: Secondary | ICD-10-CM | POA: Diagnosis not present

## 2017-02-11 DIAGNOSIS — M8580 Other specified disorders of bone density and structure, unspecified site: Secondary | ICD-10-CM

## 2017-02-11 NOTE — Progress Notes (Signed)
Cynthia Rasmussen 04/29/1946 [id number]   History:    71 y.o.  for annual gyn exam   Pwith no complaints today. Review of patient's record indicated her last bone density study was in 2017 whereby the lowest T score was at the AP spine with a value of -2.1. Right femoral neck -1.1 T score and statistically significant decrease of bone mineralization left hip with a value of -10.3%. She is taking her calcium vitamin D and regular basis. Patient had a colonoscopy in 2015 whereby benign polyps were removed and she stated a year later she was screened once again with colonoscopy was negative she was instructed to return back in 3 years.She has been followed by her cardiologist Dr. Claiborne Billings is a result of her mitral valve regurgitation and sinus bradycardia history. Her PCP is Dr. Aline Brochure has been doing her blood work. Patient's vaccine including shingles are up-to-date. Patient on normal replacement therapy. Patient with no past history of any abnormal Pap smears.   past medical history,surgical history, family history and social history were all reviewed and documented in the EPIC chart.  Gynecologic History No LMP recorded. Patient is postmenopausal. Contraception: post menopausal status Last Pap2015. Results were: normal Last [id number]. Results were: normal  Obstetric History OB History  Gravida Para Term Preterm AB Living  3 3       3   SAB TAB Ectopic Multiple Live Births               # Outcome Date GA Lbr Len/2nd Weight Sex Delivery Anes PTL Lv  3 Para           2 Para           1 Para                ROS: A ROS was performed and pertinent positives and negatives are included in the history.  GENERAL: No fevers or chills. HEENT: No change in vision, no earache, sore throat or sinus congestion. NECK: No pain or stiffness. CARDIOVASCULAR: No chest pain or pressure. No palpitations. PULMONARY: No shortness of breath, cough or wheeze. GASTROINTESTINAL: No abdominal pain, nausea, vomiting or  diarrhea, melena or bright red blood per rectum. GENITOURINARY: No urinary frequency, urgency, hesitancy or dysuria. MUSCULOSKELETAL: No joint or muscle pain, no back pain, no recent trauma. DERMATOLOGIC: No rash, no itching, no lesions. ENDOCRINE: No polyuria, polydipsia, no heat or cold intolerance. No recent change in weight. HEMATOLOGICAL: No anemia or easy bruising or bleeding. NEUROLOGIC: No headache, seizures, numbness, tingling or weakness. PSYCHIATRIC: No depression, no loss of interest in normal activity or change in sleep pattern.     Exam: chaperone present  BP 110/80   Ht 5\' 4"  (1.626 m)   Wt 167 lb (75.8 kg)   BMI 28.67 kg/m   Body mass index is 28.67 kg/m.  General appearance : Well developed well nourished female. No acute distress HEENT: Eyes: no retinal hemorrhage or exudates,  Neck supple, trachea midline, no carotid bruits, no thyroidmegaly Lungs: Clear to auscultation, no rhonchi or wheezes, or rib retractions  Heart: Regular rate and rhythm, no murmurs or gallops Breast:Examined in sitting and supine position were symmetrical in appearance, no palpable masses or tenderness,  no skin retraction, no nipple inversion, no nipple discharge, no skin discoloration, no axillary or supraclavicular lymphadenopathy Abdomen: no palpable masses or tenderness, no rebound or guarding Extremities: no edema or skin discoloration or tenderness  Pelvic:  Bartholin, Urethra, Skene Glands: Within normal  limits             Vagina: No gross lesions or discharge, atrophic changes   Cervix: No gross lesions or discharge  UteAnteverted, normal size, shape and consistency, non-tender and mobile  Adnexa  Without masses or tenderness  Anus and perineum  normal   Rectovaginal  normal sphincter tone without palpated masses or tenderness             HemPCP provides     Assessment/Plan:  71 y.o. female for annual exawith past history of osteopenia. Patient due for her next bone density study  in 2019. She scheduled for mammogram this October. Pap smear no longer indicated according to the new guidelines. We discussed importance of calcium vitamin D and weightbearing exercises for osteoporosis prevention. PCP we'll be doing her blood work.   Terrance Mass MD, 9:02 AM 02/11/2017

## 2017-05-29 ENCOUNTER — Encounter: Payer: Self-pay | Admitting: Gastroenterology

## 2017-06-02 ENCOUNTER — Ambulatory Visit: Payer: Medicare Other | Admitting: Cardiovascular Disease

## 2017-06-04 ENCOUNTER — Encounter: Payer: Self-pay | Admitting: Gynecology

## 2017-06-04 DIAGNOSIS — Z1231 Encounter for screening mammogram for malignant neoplasm of breast: Secondary | ICD-10-CM | POA: Diagnosis not present

## 2017-06-12 DIAGNOSIS — J029 Acute pharyngitis, unspecified: Secondary | ICD-10-CM | POA: Diagnosis not present

## 2017-06-17 ENCOUNTER — Ambulatory Visit (AMBULATORY_SURGERY_CENTER): Payer: Self-pay | Admitting: *Deleted

## 2017-06-17 ENCOUNTER — Other Ambulatory Visit: Payer: Self-pay

## 2017-06-17 VITALS — Ht 64.5 in | Wt 169.4 lb

## 2017-06-17 DIAGNOSIS — Z86010 Personal history of colonic polyps: Secondary | ICD-10-CM

## 2017-06-17 MED ORDER — NA SULFATE-K SULFATE-MG SULF 17.5-3.13-1.6 GM/177ML PO SOLN: 1.0000 [IU] | Freq: Once | ORAL | 0 refills | Status: AC

## 2017-06-17 NOTE — Progress Notes (Signed)
No egg or soy allergy known to patient  No issues with past sedation with any surgeries  or procedures, no intubation problems  No diet pills per patient No home 02 use per patient  No blood thinners per patient  Pt denies issues with constipation  No A fib or A flutter irregular hearbeat EMMI video sent to pt's e mail

## 2017-07-07 ENCOUNTER — Encounter: Payer: Self-pay | Admitting: Gastroenterology

## 2017-07-07 ENCOUNTER — Ambulatory Visit (AMBULATORY_SURGERY_CENTER): Payer: Medicare Other | Admitting: Gastroenterology

## 2017-07-07 VITALS — BP 120/62 | HR 61 | Temp 98.4°F | Resp 13 | Ht 64.5 in | Wt 169.0 lb

## 2017-07-07 DIAGNOSIS — D123 Benign neoplasm of transverse colon: Secondary | ICD-10-CM | POA: Diagnosis not present

## 2017-07-07 DIAGNOSIS — D120 Benign neoplasm of cecum: Secondary | ICD-10-CM

## 2017-07-07 DIAGNOSIS — Z1211 Encounter for screening for malignant neoplasm of colon: Secondary | ICD-10-CM | POA: Diagnosis not present

## 2017-07-07 DIAGNOSIS — Z86010 Personal history of colonic polyps: Secondary | ICD-10-CM

## 2017-07-07 DIAGNOSIS — K635 Polyp of colon: Secondary | ICD-10-CM | POA: Diagnosis not present

## 2017-07-07 MED ORDER — SODIUM CHLORIDE 0.9 % IV SOLN: 500.0000 mL | INTRAVENOUS | Status: DC

## 2017-07-07 NOTE — Patient Instructions (Signed)
Impression/Recomendations:  Polyp handout given to patient. Diverticulosis handout given to patient.  Resume previous diet. Continue present medications.  Repeat colonoscopy recommended for surveillance.  Date to be determined after pathology results reviewed.  No ibuprofen, naproxen, or other NSAID drugs for 2 weeks.  Tylenol only until 07/21/2017.  YOU HAD AN ENDOSCOPIC PROCEDURE TODAY AT [facility]:   Refer to the procedure report that was given to you for any specific questions about what was found during the examination.  If the procedure report does not answer your questions, please call your gastroenterologist to clarify.  If you requested that your care partner not be given the details of your procedure findings, then the procedure report has been included in a sealed envelope for you to review at your convenience later.  YOU SHOULD EXPECT: Some feelings of bloating in the abdomen. Passage of more gas than usual.  Walking can help get rid of the air that was put into your GI tract during the procedure and reduce the bloating. If you had a lower endoscopy (such as a colonoscopy or flexible sigmoidoscopy) you may notice spotting of blood in your stool or on the toilet paper. If you underwent a bowel prep for your procedure, you may not have a normal bowel movement for a few days.  Please Note:  You might notice some irritation and congestion in your nose or some drainage.  This is from the oxygen used during your procedure.  There is no need for concern and it should clear up in a day or so.  SYMPTOMS TO REPORT IMMEDIATELY:   Following lower endoscopy (colonoscopy or flexible sigmoidoscopy):  Excessive amounts of blood in the stool  Significant tenderness or worsening of abdominal pains  Swelling of the abdomen that is new, acute  Fever of 100F or higher  For urgent or emergent issues, a gastroenterologist can be reached at any hour by calling [phone]  [phone].   DIET:  We do recommend a small meal at first, but then you may proceed to your regular diet.  Drink plenty of fluids but you should avoid alcoholic beverages for 24 hours.  ACTIVITY:  You should plan to take it easy for the rest of today and you should NOT DRIVE or use heavy machinery until tomorrow (because of the sedation medicines used during the test).    FOLLOW UP: Our staff will call the number listed on your records the next business day following your procedure to check on you and address any questions or concerns that you may have regarding the information given to you following your procedure. If we do not reach you, we will leave a message.  However, if you are feeling well and you are not experiencing any problems, there is no need to return our call.  We will assume that you have returned to your regular daily activities without incident.  If any biopsies were taken you will be contacted by phone or by letter within the next 1-3 weeks.  Please call us at [phone] if you have not heard about the biopsies in 3 weeks.    SIGNATURES/CONFIDENTIALITY: You and/or your care partner have signed paperwork which will be entered into your electronic medical record.  These signatures attest to the fact that that the information above on your After Visit Summary has been reviewed and is understood.  Full responsibility of the confidentiality of this discharge information lies with you and/or your care-partner.

## 2017-07-07 NOTE — Progress Notes (Signed)
Called to room to assist during endoscopic procedure.  Patient ID and intended procedure confirmed with present staff. Received instructions for my participation in the procedure from the performing physician.  

## 2017-07-07 NOTE — Progress Notes (Signed)
Pt reported cold sx that began this past Wednesday.  She took OTC advil cold and sinus last dose was this past Friday.  Pt states, "I am on the up swing".  I do hear nasal congestion.  Temp was 98.4 and she reported "I never ran a fever".  maw

## 2017-07-07 NOTE — Progress Notes (Signed)
A/ox3 pleased with MAC, report to Jane RN 

## 2017-07-07 NOTE — Progress Notes (Signed)
Pt's states no medical or surgical changes since previsit or office visit. maw 

## 2017-07-07 NOTE — Op Note (Signed)
[facility] Patient Name: Cynthia Rasmussen Procedure Date: 07/07/2017 3:03 PM MRN: [id number] Endoscopist: Remo Lipps P. Aeryn Medici MD, MD Age: 71 Referring MD:  Date of Birth: Jan 06, 1946 Gender: Female Account #: [id number] Procedure:                Colonoscopy Indications:              Surveillance: Personal history of adenomatous                            polyps - colonoscopy in 2014 showed 2cm adenoma                            with high grade dysplasia, colonoscopy in 2015                            normal. Medicines:                Monitored Anesthesia Care Procedure:                Pre-Anesthesia Assessment:                           - Prior to the procedure, a History and Physical                            was performed, and patient medications and                            allergies were reviewed. The patient's tolerance of                            previous anesthesia was also reviewed. The risks                            and benefits of the procedure and the sedation                            options and risks were discussed with the patient.                            All questions were answered, and informed consent                            was obtained. Prior Anticoagulants: The patient has                            taken no previous anticoagulant or antiplatelet                            agents. ASA Grade Assessment: II - A patient with                            mild systemic disease. After reviewing the risks  and benefits, the patient was deemed in                            satisfactory condition to undergo the procedure.                           After obtaining informed consent, the colonoscope                            was passed under direct vision. Throughout the                            procedure, the patient's blood pressure, pulse, and                            oxygen saturations were monitored continuously. The                        Colonoscope was introduced through the anus and                            advanced to the the cecum, identified by                            appendiceal orifice and ileocecal valve. The                            colonoscopy was performed without difficulty. The                            patient tolerated the procedure well. The quality                            of the bowel preparation was good. The ileocecal                            valve, appendiceal orifice, and rectum were                            photographed. Scope In: 3:09:49 PM Scope Out: 3:36:25 PM Scope Withdrawal Time: 0 hours 22 minutes 0 seconds  Total Procedure Duration: 0 hours 26 minutes 36 seconds  Findings:                 The perianal and digital rectal examinations were                            normal.                           A 4 mm polyp was found in the cecum. The polyp was                            sessile. The polyp was removed with a cold snare.  Resection and retrieval were complete.                           A 8 mm polyp was found in the cecum. The polyp was                            sessile. The polyp was removed with a cold snare.                            Resection and retrieval were complete.                           A 4 mm polyp was found in the splenic flexure. The                            polyp was flat. The polyp was removed with a cold                            snare. Resection and retrieval were complete.                           A few small-mouthed diverticula were found in the                            sigmoid colon.                           The exam was otherwise without abnormality. Of                            note, the water jet broke during this procedure,                            which prolonged the procedure time as time was                            taken to fix it Complications:            No immediate complications.  Estimated blood loss:                            Minimal. Estimated Blood Loss:     Estimated blood loss was minimal. Impression:               - One 4 mm polyp in the cecum, removed with a cold                            snare. Resected and retrieved.                           - One 8 mm polyp in the cecum, removed with a cold                            snare. Resected and  retrieved.                           - One 4 mm polyp at the splenic flexure, removed                            with a cold snare. Resected and retrieved.                           - Diverticulosis in the sigmoid colon.                           - The examination was otherwise normal. Recommendation:           - Patient has a contact number available for                            emergencies. The signs and symptoms of potential                            delayed complications were discussed with the                            patient. Return to normal activities tomorrow.                            Written discharge instructions were provided to the                            patient.                           - Resume previous diet.                           - Continue present medications.                           - Await pathology results.                           - Repeat colonoscopy is recommended for                            surveillance. The colonoscopy date will be                            determined after pathology results from today's                            exam become available for review.                           - No ibuprofen, naproxen, or other non-steroidal                            anti-inflammatory drugs for 2  weeks after polyp                            removal. Remo Lipps P. Princella Jaskiewicz MD, MD 07/07/2017 3:41:07 PM This report has been signed electronically.

## 2017-07-08 ENCOUNTER — Telehealth: Payer: Self-pay

## 2017-07-08 ENCOUNTER — Telehealth: Payer: Self-pay | Admitting: *Deleted

## 2017-07-08 NOTE — Telephone Encounter (Signed)
Attempted to reach patient for post-procedure f/u call. No answer. Left message that we will attempt to reach her again later today and for her to please not hesitate to call us if she has any questions/concerns regarding her care. 

## 2017-07-08 NOTE — Telephone Encounter (Signed)
  Follow up Call-  Call back number 07/07/2017  Post procedure Call Back phone  # [phone] cell  Permission to leave phone message Yes  Some recent data might be hidden     Patient questions:  Do you have a fever, pain , or abdominal swelling? No. Pain Score  0 *  Have you tolerated food without any problems? Yes.    Have you been able to return to your normal activities? Yes.    Do you have any questions about your discharge instructions: Diet   No. Medications  No. Follow up visit  No.  Do you have questions or concerns about your Care? No.  Actions: * If pain score is 4 or above: No action needed, pain <4.

## 2017-07-12 ENCOUNTER — Encounter: Payer: Self-pay | Admitting: Gastroenterology

## 2017-07-17 DIAGNOSIS — M1711 Unilateral primary osteoarthritis, right knee: Secondary | ICD-10-CM | POA: Diagnosis not present

## 2017-07-21 ENCOUNTER — Encounter: Payer: Self-pay | Admitting: Physician Assistant

## 2017-07-21 ENCOUNTER — Ambulatory Visit (HOSPITAL_BASED_OUTPATIENT_CLINIC_OR_DEPARTMENT_OTHER)
Admission: RE | Admit: 2017-07-21 | Discharge: 2017-07-21 | Disposition: A | Discharge status: Home / Self Care | Payer: Medicare Other | Source: Ambulatory Visit | Attending: Internal Medicine | Admitting: Internal Medicine

## 2017-07-21 ENCOUNTER — Ambulatory Visit (INDEPENDENT_AMBULATORY_CARE_PROVIDER_SITE_OTHER): Payer: Medicare Other | Admitting: Cardiovascular Disease

## 2017-07-21 ENCOUNTER — Inpatient Hospital Stay (HOSPITAL_COMMUNITY)
Admission: AD | Admit: 2017-07-21 | Discharge: 2017-07-23 | DRG: 176 | Disposition: A | Discharge status: Home / Self Care | Payer: Medicare Other | Source: Ambulatory Visit | Attending: Family Medicine | Admitting: Family Medicine

## 2017-07-21 ENCOUNTER — Inpatient Hospital Stay (HOSPITAL_COMMUNITY): Payer: Medicare Other

## 2017-07-21 ENCOUNTER — Other Ambulatory Visit: Payer: Self-pay

## 2017-07-21 ENCOUNTER — Encounter: Payer: Self-pay | Admitting: Cardiovascular Disease

## 2017-07-21 VITALS — BP 128/76 | HR 69 | Ht 64.5 in | Wt 168.0 lb

## 2017-07-21 DIAGNOSIS — M8580 Other specified disorders of bone density and structure, unspecified site: Secondary | ICD-10-CM | POA: Diagnosis not present

## 2017-07-21 DIAGNOSIS — I82890 Acute embolism and thrombosis of other specified veins: Secondary | ICD-10-CM | POA: Diagnosis present

## 2017-07-21 DIAGNOSIS — Z13220 Encounter for screening for lipoid disorders: Secondary | ICD-10-CM | POA: Diagnosis not present

## 2017-07-21 DIAGNOSIS — R008 Other abnormalities of heart beat: Secondary | ICD-10-CM | POA: Diagnosis present

## 2017-07-21 DIAGNOSIS — I081 Rheumatic disorders of both mitral and tricuspid valves: Secondary | ICD-10-CM | POA: Diagnosis present

## 2017-07-21 DIAGNOSIS — I82441 Acute embolism and thrombosis of right tibial vein: Secondary | ICD-10-CM | POA: Diagnosis present

## 2017-07-21 DIAGNOSIS — R001 Bradycardia, unspecified: Secondary | ICD-10-CM | POA: Diagnosis present

## 2017-07-21 DIAGNOSIS — I2690 Septic pulmonary embolism without acute cor pulmonale: Secondary | ICD-10-CM | POA: Diagnosis not present

## 2017-07-21 DIAGNOSIS — I82431 Acute embolism and thrombosis of right popliteal vein: Secondary | ICD-10-CM | POA: Diagnosis present

## 2017-07-21 DIAGNOSIS — I491 Atrial premature depolarization: Secondary | ICD-10-CM | POA: Diagnosis present

## 2017-07-21 DIAGNOSIS — Z86711 Personal history of pulmonary embolism: Secondary | ICD-10-CM

## 2017-07-21 DIAGNOSIS — Z86018 Personal history of other benign neoplasm: Secondary | ICD-10-CM

## 2017-07-21 DIAGNOSIS — I82403 Acute embolism and thrombosis of unspecified deep veins of lower extremity, bilateral: Secondary | ICD-10-CM

## 2017-07-21 DIAGNOSIS — I83813 Varicose veins of bilateral lower extremities with pain: Secondary | ICD-10-CM

## 2017-07-21 DIAGNOSIS — I82409 Acute embolism and thrombosis of unspecified deep veins of unspecified lower extremity: Secondary | ICD-10-CM | POA: Diagnosis present

## 2017-07-21 DIAGNOSIS — I82411 Acute embolism and thrombosis of right femoral vein: Secondary | ICD-10-CM | POA: Diagnosis present

## 2017-07-21 DIAGNOSIS — M7989 Other specified soft tissue disorders: Secondary | ICD-10-CM

## 2017-07-21 DIAGNOSIS — R002 Palpitations: Secondary | ICD-10-CM | POA: Diagnosis not present

## 2017-07-21 DIAGNOSIS — Z7982 Long term (current) use of aspirin: Secondary | ICD-10-CM | POA: Diagnosis not present

## 2017-07-21 DIAGNOSIS — Z79899 Other long term (current) drug therapy: Secondary | ICD-10-CM | POA: Diagnosis not present

## 2017-07-21 DIAGNOSIS — M25561 Pain in right knee: Secondary | ICD-10-CM | POA: Diagnosis present

## 2017-07-21 DIAGNOSIS — R2241 Localized swelling, mass and lump, right lower limb: Secondary | ICD-10-CM | POA: Diagnosis not present

## 2017-07-21 DIAGNOSIS — I2699 Other pulmonary embolism without acute cor pulmonale: Secondary | ICD-10-CM | POA: Diagnosis not present

## 2017-07-21 DIAGNOSIS — Z87891 Personal history of nicotine dependence: Secondary | ICD-10-CM | POA: Diagnosis not present

## 2017-07-21 HISTORY — DX: Other pulmonary embolism without acute cor pulmonale: I26.99

## 2017-07-21 HISTORY — DX: Acute embolism and thrombosis of unspecified deep veins of unspecified lower extremity: I82.409

## 2017-07-21 LAB — COMPREHENSIVE METABOLIC PANEL
ALBUMIN: 3.9 g/dL (ref 3.5–5.0)
ALK PHOS: 73 U/L (ref 38–126)
ALT: 18 U/L (ref 14–54)
ANION GAP: 8 (ref 5–15)
AST: 24 U/L (ref 15–41)
BILIRUBIN TOTAL: 0.7 mg/dL (ref 0.3–1.2)
BUN: 19 mg/dL (ref 6–20)
CALCIUM: 9.3 mg/dL (ref 8.9–10.3)
CO2: 30 mmol/L (ref 22–32)
Chloride: 103 mmol/L (ref 101–111)
Creatinine, Ser: 0.9 mg/dL (ref 0.44–1.00)
GFR calc Af Amer: >60 mL/min (ref >60)
GLUCOSE: 131 mg/dL — AB (ref 65–99)
Potassium: 3.9 mmol/L (ref 3.5–5.1)
Sodium: 141 mmol/L (ref 135–145)
TOTAL PROTEIN: 7.4 g/dL (ref 6.5–8.1)

## 2017-07-21 LAB — CBC WITH DIFFERENTIAL/PLATELET
BASOS ABS: 0 10*3/uL (ref 0.0–0.1)
BASOS PCT: 0 %
EOS PCT: 2 %
Eosinophils Absolute: 0.2 10*3/uL (ref 0.0–0.7)
HCT: 40.2 % (ref 36.0–46.0)
Hemoglobin: 13.7 g/dL (ref 12.0–15.0)
Lymphocytes Relative: 21 %
Lymphs Abs: 2.1 10*3/uL (ref 0.7–4.0)
MCH: 31.5 pg (ref 26.0–34.0)
MCHC: 34.1 g/dL (ref 30.0–36.0)
MCV: 92.4 fL (ref 78.0–100.0)
MONO ABS: 0.5 10*3/uL (ref 0.1–1.0)
Monocytes Relative: 5 %
Neutro Abs: 7.2 10*3/uL (ref 1.7–7.7)
Neutrophils Relative %: 72 %
PLATELETS: 274 10*3/uL (ref 150–400)
RBC: 4.35 MIL/uL (ref 3.87–5.11)
RDW: 13.2 % (ref 11.5–15.5)
WBC: 10 10*3/uL (ref 4.0–10.5)

## 2017-07-21 LAB — PROTIME-INR
INR: 0.97
PROTHROMBIN TIME: 12.8 s (ref 11.4–15.2)

## 2017-07-21 LAB — APTT: APTT: 32 s (ref 24–36)

## 2017-07-21 LAB — HEPARIN LEVEL (UNFRACTIONATED): Heparin Unfractionated: 0.32 IU/mL (ref 0.30–0.70)

## 2017-07-21 MED ORDER — HEPARIN (PORCINE) IN NACL 100-0.45 UNIT/ML-% IJ SOLN
1200.0000 [IU]/h | INTRAMUSCULAR | Status: DC
  Administered 2017-07-22 – 2017-07-23 (×2): 1200 [IU]/h via INTRAVENOUS
  Filled 2017-07-21 (×3): qty 250

## 2017-07-21 MED ORDER — ACETAMINOPHEN 325 MG PO TABS: 650.0000 mg | ORAL_TABLET | ORAL | Status: DC | PRN

## 2017-07-21 MED ORDER — ZOLPIDEM TARTRATE 5 MG PO TABS: 5.0000 mg | ORAL_TABLET | Freq: Every evening | ORAL | Status: DC | PRN

## 2017-07-21 MED ORDER — ONDANSETRON HCL 4 MG/2ML IJ SOLN: 4.0000 mg | Freq: Four times a day (QID) | INTRAMUSCULAR | Status: DC | PRN

## 2017-07-21 MED ORDER — CEPHALEXIN 500 MG PO CAPS: ORAL_CAPSULE | ORAL | 0 refills | Status: DC

## 2017-07-21 MED ORDER — NITROGLYCERIN 0.4 MG SL SUBL: 0.4000 mg | SUBLINGUAL_TABLET | SUBLINGUAL | Status: DC | PRN

## 2017-07-21 MED ORDER — HEPARIN BOLUS VIA INFUSION
4000.0000 [IU] | Freq: Once | INTRAVENOUS | Status: AC
  Administered 2017-07-21: 4000 [IU] via INTRAVENOUS
  Filled 2017-07-21: qty 4000

## 2017-07-21 MED ORDER — IOPAMIDOL (ISOVUE-370) INJECTION 76%
INTRAVENOUS | Status: AC
  Administered 2017-07-21: 100 mL
  Filled 2017-07-21: qty 100

## 2017-07-21 MED ORDER — SODIUM CHLORIDE 0.9% FLUSH
3.0000 mL | Freq: Two times a day (BID) | INTRAVENOUS | Status: DC
  Administered 2017-07-22 – 2017-07-23 (×2): 3 mL via INTRAVENOUS

## 2017-07-21 MED ORDER — SODIUM CHLORIDE 0.9% FLUSH: 3.0000 mL | INTRAVENOUS | Status: DC | PRN

## 2017-07-21 MED ORDER — ALPRAZOLAM 0.25 MG PO TABS: 0.2500 mg | ORAL_TABLET | Freq: Two times a day (BID) | ORAL | Status: DC | PRN

## 2017-07-21 MED ORDER — SODIUM CHLORIDE 0.9 % IV SOLN: 250.0000 mL | INTRAVENOUS | Status: DC | PRN

## 2017-07-21 NOTE — Patient Instructions (Signed)
Medication Instructions:  START Keflex --take 500 mg (1 tablet) three times a day (every 8 hours) for the first day --then take 500 mg (1 tablet) two times daily for 9 days  Testing/Procedures: Your physician has requested that you have a lower extremity venous duplex TODAY. This test is an ultrasound of the veins in the legs. It looks at venous blood flow that carries blood from the heart to the legs. Allow one hour for a Lower Venous exam.  There are no restrictions or special instructions.  Follow-Up: You have been referred to [facility]. --the office will call to schedule this appt.  Your physician wants you to follow-up in: 6 months with Dr. Claiborne Billings. You will receive a reminder letter in the mail two months in advance. If you don't receive a letter, please call our office to schedule the follow-up appointment.   Any Other Special Instructions Will Be Listed Below (If Applicable).     If you need a refill on your cardiac medications before your next appointment, please call your pharmacy.

## 2017-07-21 NOTE — Plan of Care (Signed)
Pt with pain in the right lower extremity. RLE swollen and red in the inner knee/thigh area (area marked). Pt receiving anticoagulation therapy with heparin drip. Pt advised to use BSC for tonight instead of ambulating to bathroom. Pt understands and agrees.

## 2017-07-21 NOTE — Progress Notes (Signed)
ANTICOAGULATION CONSULT NOTE - Follow Up Consult  Pharmacy Consult for Heparin Indication: DVT  No Known Allergies  Patient Measurements: Height: 5' 4.5" (163.8 cm) IBW/kg (Calculated) : 55.85 Weight = 76 kg  Vital Signs: BP: 128/76 (12/17 1053) Pulse Rate: 69 (12/17 1053)   Assessment: 71 year old female to begin heparin for a new DVT, rule out PE  Goal of Therapy:  Heparin level 0.3-0.7 units/ml Monitor platelets by anticoagulation protocol: Yes   Plan:  Heparin 4000 units iv bolus x 1 Heparin drip at 1100 units / hr Heparin level 6 hours after heparin starts Daily heparin level, CBC  Thank you Anette Guarneri, PharmD [phone]  07/21/2017,4:20 PM

## 2017-07-21 NOTE — Progress Notes (Signed)
Patient ID: Cynthia Rasmussen, female   DOB: 03/14/1946, 71 y.o.   MRN: [id number]     HPI: Cynthia Rasmussen is a 71 y.o. female who presents to the office today for a 60 month cardiology evaluation. 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 Initially she developed significant fatigue on Toprol but has  Tolerated low dose bystolic. A 2-D echo Doppler study in 2010 showed normal systolic function and possible early borderline diastolic dysfunction with an E/A ratio of 0.94. She had normal pulmonary pressures. She did have mild thickening of her mitral valve leaflets without definitive prolapse and had mild mitral regurgitation.  She had been followed by Cynthia Rasmussen for GYN, who is recently retired.  She has a history of osteopenia.  She also sees Cynthia Rasmussen, for primary care.  She was last seen in our office in July 2017 by Cynthia Rasmussen.  Since I last saw her, she feels the Bystolic has controlled her palpitations.  She denies any episodes of chest pain or shortness of breath.  Of note, he drove to [place], [place] for three-hour drive on Tuesday and came back on Wednesday.  On Thursday she underwent an injection into her right knee due to significant arthritic symptoms.  Over the past several days she has noticed worsening of her generalized significant lower extremity varicosities but also has noticed tenderness, some swelling in her right thigh which is become very tender to touch.  She denies any increasing shortness of breath.  She presents for evaluation.  Past Medical History:  Diagnosis Date  . APC (atrial premature contractions)    DR. Claiborne Billings  . Hx of echocardiogram 2010   Which showed normal systolic function as well as mitral valve E:A ratio of 0.94 with normal pulmonary artery pressures with RV pressure of 22. She did have evidence of mild  thickening of her mitral valve leaflefts, and mitral valve prolapse cannot be completely excluded. She did have mild mitral regurgitation. There also was moderate tricuspid regurgitation.  . Osteopenia   . Thrombophlebitis    WITH PREGNANCY    Past Surgical History:  Procedure Laterality Date  . bone tumors Right    x 2 benign  . COLONOSCOPY    . LEG SURGERY  1964   BENIGN BONE TUMOR; right leg  . POLYPECTOMY    . TONSILLECTOMY AND ADENOIDECTOMY    . TUBAL LIGATION  1985  . WISDOM TOOTH EXTRACTION  1965    No Known Allergies  Current Outpatient Medications  Medication Sig Dispense Refill  . Docosahexaenoic Acid (DHA COMPLETE PO) Take 30 mg by mouth daily.    . Multiple Vitamin (MULTIVITAMIN) capsule Take 1 capsule by mouth daily.    . nebivolol (BYSTOLIC) 5 MG tablet Take 1/2 -1 tablet daily 30 tablet 11  . psyllium (REGULOID) 0.52 G capsule Take 0.52 g by mouth daily.    . cephALEXin (KEFLEX) 500 MG capsule Take 1 capsule (500 mg total) by mouth 3 (three) times daily for 1 day, THEN 1 capsule (500 mg total) 2 (two) times daily for 9 days. 21 capsule 0   No current facility-administered medications for this visit.     Social History   Socioeconomic History  . Marital status: Married    Spouse name: Not on file  . Number of children: Not on file  .  Years of education: Not on file  . Highest education level: Not on file  Social Needs  . Financial resource strain: Not on file  . Food insecurity - worry: Not on file  . Food insecurity - inability: Not on file  . Transportation needs - medical: Not on file  . Transportation needs - non-medical: Not on file  Occupational History  . Not on file  Tobacco Use  . Smoking status: Former Smoker    Types: Cigarettes    Last attempt to quit: 11/27/1975    Years since quitting: 41.6  . Smokeless tobacco: Never Used  Substance and Sexual Activity  . Alcohol use: Yes    Alcohol/week: 1.2 - 2.4 oz    Types: 2 - 4 Glasses of wine  per week  . Drug use: No  . Sexual activity: Yes    Partners: Male  Other Topics Concern  . Not on file  Social History Narrative  . Not on file    Family History  Problem Relation Age of Onset  . Osteoporosis Mother   . Hypertension Father   . Heart disease Father   . Hypertension Sister   . Cancer Maternal Uncle        LIVER  . Colon cancer Neg Hx   . Esophageal cancer Neg Hx   . Rectal cancer Neg Hx   . Stomach cancer Neg Hx   . Pancreatic cancer Neg Hx   . Prostate cancer Neg Hx    Additional social history is notable that she is originally from outside of [place] area. She has 3 children ages 60 and 64, one child lives in [place], [place] and the others live in [place]. 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 She completed college. His remote tobacco history for 10 years but she quit in 1977. She does drink occasional wine.   ROS General: Negative; No fevers, chills, or night sweats;  HEENT: Negative; No changes in vision or hearing, sinus congestion, difficulty swallowing Pulmonary: Negative; No cough, wheezing, shortness of breath, hemoptysis Cardiovascular: Negative; No chest pain, presyncope, syncope, palpitations Positive for lower extremity varicosities and tenderness to her right thigh GI: Negative; No nausea, vomiting, diarrhea, or abdominal pain GU: Negative; No dysuria, hematuria, or difficulty voiding Musculoskeletal: Right knee discomfort, status post injection Hematologic/Oncology: Negative; no easy bruising, bleeding Endocrine: Negative; no heat/cold intolerance; no diabetes Neuro: Negative; no changes in balance, headaches Skin: Negative; No rashes or skin lesions Psychiatric: Negative; No behavioral problems, depression Sleep: Negative; No snoring, daytime sleepiness, hypersomnolence, bruxism, restless legs, hypnogognic hallucinations,  no cataplexy Other comprehensive 14 point system review is negative.   PE BP 128/76   Pulse 69   Ht 5' 4.5" (1.638 m)   Wt 168 lb (76.2 kg)   BMI 28.39 kg/m    Repeat blood pressure by me was 132/80  Wt Readings from Last 3 Encounters:  07/21/17 168 lb (76.2 kg)  07/07/17 169 lb (76.7 kg)  06/17/17 169 lb 6.4 oz (76.8 kg)   General: Alert, oriented, no distress.  Skin: Localized erythema, warm to touch and significant tenderness in the right medial thigh above the knee HEENT: Normocephalic, atraumatic. Pupils equal round and reactive to light; sclera anicteric; extraocular muscles intact;  Nose without nasal septal hypertrophy Mouth/Parynx benign; Mallinpatti scale3 Neck: No JVD, no carotid bruits; normal carotid upstroke Lungs: clear to ausculatation and percussion; no wheezing or rales Chest  wall: without tenderness to palpitation Heart: PMI not displaced, RRR, s1 s2 normal, 1/6 systolic murmur, no diastolic murmur, no rubs, gallops, thrills, or heaves Abdomen: soft, nontender; no hepatosplenomehaly, BS+; abdominal aorta nontender and not dilated by palpation. Back: no CVA tenderness Pulses 2+ Musculoskeletal: full range of motion, normal strength, no joint deformities Extremities: There are significant large lower extremity varicosities, right leg greater than left.  There is significant tenderness, erythema, and warmth in the medial right thigh focally with significant tenderness below and suggestion of possible underlying thrombus by palpation Neurologic: grossly nonfocal; Cranial nerves grossly wnl Psychologic: Normal mood and affect   ECG (independently read by me): Normal sinus rhythm at 69 bpm.  No ectopy.  Normal intervals.  February 2016 ECG (independently read by me): Sinus bradycardia at 48 bpm.  Normal intervals.  ECG: Sinus rhythm at 55 beats per minute. Intervals are normal.  LABS: BMP Latest Ref Rng & Units 09/09/2014 05/02/2011  Glucose 70 - 99 mg/dL 89 79    BUN 6 - 23 mg/dL 21 -  Creatinine 0.50 - 1.10 mg/dL 0.92 -  Sodium 135 - 145 mEq/L 142 -  Potassium 3.5 - 5.3 mEq/L 4.3 -  Chloride 96 - 112 mEq/L 105 -  CO2 19 - 32 mEq/L 26 -  Calcium 8.4 - 10.5 mg/dL 9.1 -   Hepatic Function Latest Ref Rng & Units 09/09/2014  Total Protein 6.0 - 8.3 g/dL 6.8  Albumin 3.5 - 5.2 g/dL 4.2  AST 0 - 37 U/L 21  ALT 0 - 35 U/L 17  Alk Phosphatase 39 - 117 U/L 70  Total Bilirubin 0.2 - 1.2 mg/dL 0.6   CBC Latest Ref Rng & Units 09/09/2014 09/09/2012  WBC 4.0 - 10.5 K/uL 5.8 7.2  Hemoglobin 12.0 - 15.0 g/dL 13.9 14.0  Hematocrit 36.0 - 46.0 % 40.8 40.5  Platelets 150 - 400 K/uL 282 318   Lab Results  Component Value Date   TSH 2.836 09/09/2014   Lab Results  Component Value Date   MCV 91.5 09/09/2014   MCV 88.6 09/09/2012   Lipid Panel     Component Value Date/Time   CHOL 192 09/09/2014 0820   TRIG 116 09/09/2014 0820   HDL 53 09/09/2014 0820   CHOLHDL 3.6 09/09/2014 0820   VLDL 23 09/09/2014 0820   LDLCALC 116 (H) 09/09/2014 0820     Lower extremity venous Doppler studies done in the office immediately following my examination confirm evidence for acute DVT in the right common femoral vein, femoral vein, popliteal vein, posterior tibial vein, peroneal vein, and gastrocnemius vein.  There is also evidence for acute superficial thrombosis of the varicosities and other superficial veins and great saphenous vein.  On the left.  There is acute DVT in the gastrocnemius vein, posterior tibial vein, and peroneal vein.  IMPRESSION:  1. Leg DVT (deep venous thromboembolism), acute, bilateral ([place])   2. Swelling of right lower extremity   3. Palpitations   4. Varicose veins of both lower extremities with pain   5. Sinus bradycardia   6. Osteopenia, unspecified location   7. Medication management   8. Lipid screening     ASSESSMENT AND PLAN: Cynthia Rasmussen is a 72 year old female who has a previous history of palpitations and remotely was found  to have a bigeminal rhythm with PACs as well as blocked PACs that has successfully been treated with Bystolic.  She has tolerated this well.  A remote 2D echo Doppler study in 2010  showed normal systolic function with borderline diastolic dysfunction.  From a palpitation perspective, she has been doing well without recurrent symptoms.  Her blood pressure today is stable.  On exam, she has significant lower extremity varicosities and I suspect has significant venous insufficiency.  However, there is an area of definite erythema, warmth, just above her right knee suggestive of ossible superficial cellulitis in her right thigh.  This is exquisitely tender to touch.  Her Doppler studies which were done immediately following my office visit due to suspicion for DVT confirm bilateral acute DVT.  Her right lower extremity DVT is extensive and extends from the gastrocnemius vein in the right lower extremity all the way up to the common femoral vein.  There is obvious "smoke in the vein " suggestive of early thrombus with dilation of her common femoral vein.  There also is acute left DVT.  Because of high risk findings it iis my recommendation that the patient be hospitalized rather than treated with oral anticoagulation.  She will be admitted to [facility] hospital, started on IV heparin therapy, and consider evaluation for PE.  She will also be treated with antibiotic therapy for probable cellulitis.  Ultimately, she will need referral to vein and vascular specialists in light of her significant lower extremity varicosities for potential ablative therapy.  Laboratory will also be checked in the fasting state.    Troy Sine, MD, [facility]  07/21/2017 1:58 PM

## 2017-07-21 NOTE — H&P (Signed)
Patient ID: Cynthia Rasmussen, female   DOB: Jan 11, 1946, 71 y.o.   MRN: [id number]     HPI: Cynthia Rasmussen is a 71 y.o. female who presents to the office today for a 29 month cardiology evaluation. I last saw her in October 2014  [name] has a history of atrial ectopy/bigeminy with APCs as well as blocked APCs that has been treated with beta blocker therapy. Initially she developed significant fatigue on Toprol but has  Tolerated low dose bystolic. A 2-D echo Doppler study in 2010 showed normal systolic function and possible early borderline diastolic dysfunction with an E/A ratio of 0.94. She had normal pulmonary pressures. She did have mild thickening of her mitral valve leaflets without definitive prolapse and had mild mitral regurgitation.  She had been followed by Dr. Uvaldo Rising for GYN, who is recently retired.  She has a history of osteopenia.  She also sees Dr. Azalia Bilis, for primary care.  She was last seen in our office in July 2017 by Rosaria Ferries.  Since I last saw her, she feels the Bystolic has controlled her palpitations.  She denies any episodes of chest pain or shortness of breath.  Of note, he drove to [place], [place] for three-hour drive on Tuesday and came back on Wednesday.  On Thursday she underwent an injection into her right knee due to significant arthritic symptoms.  Over the past several days she has noticed worsening of her generalized significant lower extremity varicosities but also has noticed tenderness, some swelling in her right thigh which is become very tender to touch.  She denies any increasing shortness of breath.  She presents for evaluation.      Past Medical History:  Diagnosis Date  . APC (atrial premature contractions)    DR. Claiborne Billings  . Hx of echocardiogram 2010   Which showed normal systolic function as well as mitral valve E:A ratio of 0.94 with normal pulmonary artery pressures with RV pressure of 22. She did have evidence  of mild thickening of her mitral valve leaflefts, and mitral valve prolapse cannot be completely excluded. She did have mild mitral regurgitation. There also was moderate tricuspid regurgitation.  . Osteopenia   . Thrombophlebitis    WITH PREGNANCY         Past Surgical History:  Procedure Laterality Date  . bone tumors Right    x 2 benign  . COLONOSCOPY    . LEG SURGERY  1964   BENIGN BONE TUMOR; right leg  . POLYPECTOMY    . TONSILLECTOMY AND ADENOIDECTOMY    . TUBAL LIGATION  1985  . WISDOM TOOTH EXTRACTION  1965    No Known Allergies        Current Outpatient Medications  Medication Sig Dispense Refill  . Docosahexaenoic Acid (DHA COMPLETE PO) Take 30 mg by mouth daily.    . Multiple Vitamin (MULTIVITAMIN) capsule Take 1 capsule by mouth daily.    . nebivolol (BYSTOLIC) 5 MG tablet Take 1/2 -1 tablet daily 30 tablet 11  . psyllium (REGULOID) 0.52 G capsule Take 0.52 g by mouth daily.    . cephALEXin (KEFLEX) 500 MG capsule Take 1 capsule (500 mg total) by mouth 3 (three) times daily for 1 day, THEN 1 capsule (500 mg total) 2 (two) times daily for 9 days. 21 capsule 0    Social History        Socioeconomic History  . Marital status: Married    Spouse name: Not on  file  . Number of children: Not on file  . Years of education: Not on file  . Highest education level: Not on file  Social Needs  . Financial resource strain: Not on file  . Food insecurity - worry: Not on file  . Food insecurity - inability: Not on file  . Transportation needs - medical: Not on file  . Transportation needs - non-medical: Not on file  Occupational History  . Retired  Tobacco Use  . Smoking status: Former Smoker    Types: Cigarettes    Last attempt to quit: 11/27/1975    Years since quitting: 41.6  . Smokeless tobacco: Never Used  Substance and Sexual Activity  . Alcohol use: Yes    Alcohol/week: 1.2 - 2.4 oz    Types: 2 - 4 Glasses of wine  per week  . Drug use: No  . Sexual activity: Yes    Partners: Male  Other Topics Concern  . Not on file  Social History Narrative  . Not on file         Family History  Problem Relation Age of Onset  . Osteoporosis Mother   . Hypertension Father   . Heart disease Father   . Hypertension Sister   . Cancer Maternal Uncle        LIVER  . Colon cancer Neg Hx   . Esophageal cancer Neg Hx   . Rectal cancer Neg Hx   . Stomach cancer Neg Hx   . Pancreatic cancer Neg Hx   . Prostate cancer Neg Hx    Family Status  Relation Name Status  . Mother  Deceased  . Father  Deceased  . Sister  Alive  . Mat Uncle  Deceased  . Sister  Alive  . Neg Hx  (Not Specified)    Additional social history is notable that she is originally from outside of [place] area. She has 3 children ages 46, 25 and 27, one child lives in [place], [place] and the others live in [place], [place]. 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 She completed college. His remote tobacco history for 10 years but she quit in 1977. She does drink occasional wine.   ROS General: Negative; No fevers, chills, or night sweats;  HEENT: Negative; No changes in vision or hearing, sinus congestion, difficulty swallowing Pulmonary: Negative; No cough, wheezing, shortness of breath, hemoptysis Cardiovascular: Negative; No chest pain, presyncope, syncope, palpitations Positive for lower extremity varicosities and tenderness to her right thigh GI: Negative; No nausea, vomiting, diarrhea, or abdominal pain GU: Negative; No dysuria, hematuria, or difficulty voiding Musculoskeletal: Right knee discomfort, status post injection Hematologic/Oncology: Negative; no easy bruising, bleeding Endocrine: Negative; no heat/cold intolerance; no diabetes Neuro: Negative; no changes in balance, headaches Skin: Negative; No  rashes or skin lesions Psychiatric: Negative; No behavioral problems, depression Sleep: Negative; No snoring, daytime sleepiness, hypersomnolence, bruxism, restless legs, hypnogognic hallucinations, no cataplexy Other comprehensive 14 point system review is negative.   PE BP 128/76   Pulse 69   Ht 5' 4.5" (1.638 m)   Wt 168 lb (76.2 kg)   BMI 28.39 kg/m    Repeat blood pressure by me was 132/80     Wt Readings from Last 3 Encounters:  07/21/17 168 lb (76.2 kg)  07/07/17 169 lb (76.7 kg)  06/17/17 169 lb 6.4 oz (76.8 kg)   General: Alert, oriented, no distress.  Skin: Localized  erythema, warm to touch and significant tenderness in the right medial thigh above the knee HEENT: Normocephalic, atraumatic. Pupils equal round and reactive to light; sclera anicteric; extraocular muscles intact;  Nose without nasal septal hypertrophy Mouth/Parynx benign; Mallinpatti scale3 Neck: No JVD, no carotid bruits; normal carotid upstroke Lungs: clear to ausculatation and percussion; no wheezing or rales Chest wall: without tenderness to palpitation Heart: PMI not displaced, RRR, s1 s2 normal, 1/6 systolic murmur, no diastolic murmur, no rubs, gallops, thrills, or heaves Abdomen: soft, nontender; no hepatosplenomehaly, BS+; abdominal aorta nontender and not dilated by palpation. Back: no CVA tenderness Pulses 2+ Musculoskeletal: full range of motion, normal strength, no joint deformities Extremities: There are significant large lower extremity varicosities, right leg greater than left.  There is significant tenderness, erythema, and warmth in the medial right thigh focally with significant tenderness below and suggestion of possible underlying thrombus by palpation Neurologic: grossly nonfocal; Cranial nerves grossly wnl Psychologic: Normal mood and affect   ECG (independently read by me): Normal sinus rhythm at 69 bpm.  No ectopy.  Normal intervals.  February 2016 ECG (independently  read by me): Sinus bradycardia at 48 bpm.  Normal intervals.  ECG: Sinus rhythm at 55 beats per minute. Intervals are normal.  LABS: BMP Latest Ref Rng & Units 09/09/2014 05/02/2011  Glucose 70 - 99 mg/dL 89 79  BUN 6 - 23 mg/dL 21 -  Creatinine 0.50 - 1.10 mg/dL 0.92 -  Sodium 135 - 145 mEq/L 142 -  Potassium 3.5 - 5.3 mEq/L 4.3 -  Chloride 96 - 112 mEq/L 105 -  CO2 19 - 32 mEq/L 26 -  Calcium 8.4 - 10.5 mg/dL 9.1 -   Hepatic Function Latest Ref Rng & Units 09/09/2014  Total Protein 6.0 - 8.3 g/dL 6.8  Albumin 3.5 - 5.2 g/dL 4.2  AST 0 - 37 U/L 21  ALT 0 - 35 U/L 17  Alk Phosphatase 39 - 117 U/L 70  Total Bilirubin 0.2 - 1.2 mg/dL 0.6   CBC Latest Ref Rng & Units 09/09/2014 09/09/2012  WBC 4.0 - 10.5 K/uL 5.8 7.2  Hemoglobin 12.0 - 15.0 g/dL 13.9 14.0  Hematocrit 36.0 - 46.0 % 40.8 40.5  Platelets 150 - 400 K/uL 282 318   RecentLabs       Lab Results  Component Value Date   TSH 2.836 09/09/2014     RecentLabs       Lab Results  Component Value Date   MCV 91.5 09/09/2014   MCV 88.6 09/09/2012     Lipid Panel  Labs(Brief)          Component Value Date/Time   CHOL 192 09/09/2014 0820   TRIG 116 09/09/2014 0820   HDL 53 09/09/2014 0820   CHOLHDL 3.6 09/09/2014 0820   VLDL 23 09/09/2014 0820   LDLCALC 116 (H) 09/09/2014 0820       Lower extremity venous Doppler studies done in the office immediately following my examination confirm evidence for acute DVT in the right common femoral vein, femoral vein, popliteal vein, posterior tibial vein, peroneal vein, and gastrocnemius vein.  There is also evidence for acute superficial thrombosis of the varicosities and other superficial veins and great saphenous vein.  On the left.  There is acute DVT in the gastrocnemius vein, posterior tibial vein, and peroneal vein.  IMPRESSION:  1. Leg DVT (deep venous thromboembolism), acute, bilateral ([place])   2. Swelling of right lower extremity   3. Palpitations    4. Varicose veins  of both lower extremities with pain   5. Sinus bradycardia   6. Osteopenia, unspecified location   7. Medication management   8. Lipid screening     ASSESSMENT AND PLAN: Ms. Zainah Steven is a 71 year old female who has a previous history of palpitations and remotely was found to have a bigeminal rhythm with PACs as well as blocked PACs that has successfully been treated with Bystolic.  She has tolerated this well.  A remote 2D echo Doppler study in 2010 showed normal systolic function with borderline diastolic dysfunction.  From a palpitation perspective, she has been doing well without recurrent symptoms.  Her blood pressure today is stable.  On exam, she has significant lower extremity varicosities and I suspect has significant venous insufficiency.  However, there is an area of definite erythema, warmth, just above her right knee suggestive of ossible superficial cellulitis in her right thigh.  This is exquisitely tender to touch.  Her Doppler studies which were done immediately following my office visit due to suspicion for DVT confirm bilateral acute DVT.  Her right lower extremity DVT is extensive and extends from the gastrocnemius vein in the right lower extremity all the way up to the common femoral vein.  There is obvious "smoke in the vein " suggestive of early thrombus with dilation of her common femoral vein.  There also is acute left DVT.  Because of high risk findings it iis my recommendation that the patient be hospitalized rather than treated with oral anticoagulation.  She will be admitted to [facility] hospital, started on IV heparin therapy, and consider evaluation for PE.  She will also be treated with antibiotic therapy for probable cellulitis.  Ultimately, she will need referral to vein and vascular specialists in light of her significant lower extremity varicosities for potential ablative therapy.  Laboratory will also be checked in the fasting state.    Troy Sine, MD, [facility]  07/21/2017 1:58 PM

## 2017-07-21 NOTE — Progress Notes (Signed)
  I was  Paged by  Radiology with findings of Right lung PE Per  Radiology no RIGHT  HEART  STRAIN  Was  Seen Vitals BP, 110/60 HR 78 AWAKE ALERT  ORIENTED, no  Chest pain , Pulse OX- 98 On Heparin gtt-  Recent cardiology provider notes   Cynthia Rasmussen a 71 year old female who has a previous history of palpitations and remotely was found to have a bigeminal rhythm with PACs as well as blocked PACs that has successfully been treated with Bystolic. She has tolerated this well. A remote 2D echo Doppler study in 2010 showed normal systolic function with borderline diastolic dysfunction. From a palpitation perspective, she has been doing well without recurrent symptoms. Her blood pressure today is stable. On exam, she has significant lower extremity varicosities and I suspect has significant venous insufficiency. However, there is an area of definite erythema, warmth, just above her right knee suggestive of ossible superficial cellulitis in her right thigh. This is exquisitely tender to touch. Her Doppler studies which were done immediately following my office visit due to suspicion for DVT confirm bilateral acute DVT. Her right lower extremity DVT is extensive and extends from the gastrocnemius vein in the right lower extremity all the way up to the common femoral vein. There is obvious "smoke in the vein " suggestive of early thrombus with dilation of her common femoral vein. There also is acute left DVT. Because of high risk findings it iis my recommendation that the patient be hospitalized rather than treated with oral anticoagulation. She will be admitted to [facility] hospital, started on IV heparin therapy, and consider evaluation for PE. She will also be treated with antibiotic therapy for probable cellulitis. Ultimately, she will need referral to vein and vascular specialists in light of her significant lower extremityvaricosities for potential ablative therapy. Laboratory will also be  checked in the fasting state.   Echo, ORDERED for  Tomorrow As  Pt  Is not having  Any  Right  Heart  Strain  Nor hypotension or  Symptoms  Such  As  Hypoxia or  Chest pain or  Dyspnea or  Arrhythmia, no EKOS  Need or  consilt  Was  Initiated,  Will convey to cardiology team AM .

## 2017-07-22 ENCOUNTER — Inpatient Hospital Stay (HOSPITAL_COMMUNITY): Payer: Medicare Other

## 2017-07-22 ENCOUNTER — Encounter (HOSPITAL_COMMUNITY): Payer: Self-pay | Admitting: Internal Medicine

## 2017-07-22 DIAGNOSIS — I82401 Acute embolism and thrombosis of unspecified deep veins of right lower extremity: Secondary | ICD-10-CM

## 2017-07-22 DIAGNOSIS — I2690 Septic pulmonary embolism without acute cor pulmonale: Secondary | ICD-10-CM

## 2017-07-22 DIAGNOSIS — I491 Atrial premature depolarization: Secondary | ICD-10-CM | POA: Diagnosis present

## 2017-07-22 DIAGNOSIS — I82411 Acute embolism and thrombosis of right femoral vein: Secondary | ICD-10-CM | POA: Insufficient documentation

## 2017-07-22 DIAGNOSIS — I2699 Other pulmonary embolism without acute cor pulmonale: Secondary | ICD-10-CM | POA: Diagnosis present

## 2017-07-22 LAB — CBC
HEMATOCRIT: 38.3 % (ref 36.0–46.0)
Hemoglobin: 12.8 g/dL (ref 12.0–15.0)
MCH: 30.8 pg (ref 26.0–34.0)
MCHC: 33.4 g/dL (ref 30.0–36.0)
MCV: 92.3 fL (ref 78.0–100.0)
PLATELETS: 237 10*3/uL (ref 150–400)
RBC: 4.15 MIL/uL (ref 3.87–5.11)
RDW: 13.4 % (ref 11.5–15.5)
WBC: 8.4 10*3/uL (ref 4.0–10.5)

## 2017-07-22 LAB — ECHOCARDIOGRAM COMPLETE
Ao-asc: 34 cm
CHL CUP MV DEC (S): 292
E/e' ratio: 8.31
EWDT: 292 ms
FS: 21 % — AB (ref 28–44)
Height: 64.5 in
IV/PV OW: 1.11
LA vol index: 25.6 mL/m2
LA vol: 47.9 mL
LADIAMINDEX: 1.82 cm/m2
LASIZE: 34 mm
LAVOLA4C: 50.5 mL
LDCA: 3.14 cm2
LEFT ATRIUM END SYS DIAM: 34 mm
LV E/e' medial: 8.31
LV E/e'average: 8.31
LV PW d: 9 mm — AB (ref 0.6–1.1)
LV TDI E'MEDIAL: 6.09
LVOTD: 20 mm
MV pk A vel: 72.1 m/s
MV pk E vel: 50.6 m/s
MVAP: 2.56 cm2
P 1/2 time: 86 ms
RV LATERAL S' VELOCITY: 14.9 cm/s
TAPSE: 25.8 mm
Weight: 2654.4 oz

## 2017-07-22 LAB — LIPID PANEL
CHOL/HDL RATIO: 3.5 ratio
CHOLESTEROL: 174 mg/dL (ref 0–200)
HDL: 50 mg/dL (ref >40)
LDL Cholesterol: 101 mg/dL — ABNORMAL HIGH (ref 0–99)
TRIGLYCERIDES: 117 mg/dL (ref <150)
VLDL: 23 mg/dL (ref 0–40)

## 2017-07-22 LAB — HEPARIN LEVEL (UNFRACTIONATED): Heparin Unfractionated: 0.37 IU/mL (ref 0.30–0.70)

## 2017-07-22 MED ORDER — NEBIVOLOL HCL 5 MG PO TABS
2.5000 mg | ORAL_TABLET | Freq: Every day | ORAL | Status: DC
  Administered 2017-07-22 – 2017-07-23 (×2): 2.5 mg via ORAL
  Filled 2017-07-22 (×2): qty 1

## 2017-07-22 MED ORDER — HYDROCODONE-ACETAMINOPHEN 5-325 MG PO TABS: 1.0000 | ORAL_TABLET | Freq: Four times a day (QID) | ORAL | Status: DC | PRN

## 2017-07-22 NOTE — Progress Notes (Addendum)
Benefit checks sent for Cynthia Rasmussen and Cynthia Rasmussen  Jacqualin Combes, RN        # 5.  I HAVE CHECK WITH [facility] GOV - NO RX COVERAGE       I CALL [facility] - PATIENT HAS DISCOUNT CARD   Previous Messages

## 2017-07-22 NOTE — H&P (Signed)
History and Physical    Cynthia Rasmussen QVZ:[id number] DOB: [birth year] DOA: 07/21/2017  PCP: Shirline Frees, MD Patient coming from: assumed care after admission by Dr Claiborne Billings 07/21/17  Chief Complaint: DVT  HPI: Cynthia Rasmussen is a pleasant 71 y.o. female with medical history significant for palpitations, PACs successfully treated with Bystolic, significant LE varicosities, anxiety, was admitted yesterday by Dr. Claiborne Billings cardiology service for acute bilateral DVT that was an incidental finding on patient's routine cardiology checkup. After admission CT angio the chest revealed Incompletely obstructing pulmonary emboli in the right intralobar pulmonary artery and in several right lower lobe pulmonary artery segments. No more central pulmonary embolus evident. No right heart Strain. Heparin gtt started per pharmacy. Today [facility] asked to assume care  Information is obtained from the chart and the patient. She states that over the last several weeks she has experienced pain swelling right inner thigh just above the knee. Associated symptoms include erythema and heat to that same area. She states that 4 days ago she received an injection in her knee and recently she also spent 6 hours on the cart traveling to asphalt and back. He said the pain swelling erythema has just worsened and yesterday she was a routine follow-up visit with cardiology who noted on exam the swelling and erythema and obtained Doppler studies in the office which revealed right lower extremity DVT extensive and extends from the gastrocnemius vein all way up to common femoral vein. Also noted an acute left DVT. It was decided due to high-risk findings she would be admitted and heparin started and workup for PE initiated. Of note patient does indicate that symptoms started before her trip to ask full and her knee injection. She denies chest pain palpitations shortness of breath. She denies headache dizziness syncope or near-syncope. She  denies any abdominal pain nausea vomiting diarrhea constipation melena bright red blood per rectum. She denies dysuria hematuria frequency or urgency.    ED Course: N/A  Review of Systems: As per HPI otherwise all other systems reviewed and are negative.   Ambulatory Status: Ambulates independently is independent with ADLs  Past Medical History:  Diagnosis Date  . APC (atrial premature contractions)    DR. Claiborne Billings  . DVT (deep venous thrombosis) ([place])   . Hx of echocardiogram 2010   Which showed normal systolic function as well as mitral valve E:A ratio of 0.94 with normal pulmonary artery pressures with RV pressure of 22. She did have evidence of mild thickening of her mitral valve leaflefts, and mitral valve prolapse cannot be completely excluded. She did have mild mitral regurgitation. There also was moderate tricuspid regurgitation.  . Osteopenia   . Pulmonary embolus ([place])   . Thrombophlebitis    WITH PREGNANCY    Past Surgical History:  Procedure Laterality Date  . bone tumors Right    x 2 benign  . COLONOSCOPY    . LEG SURGERY  1964   BENIGN BONE TUMOR; right leg  . POLYPECTOMY    . TONSILLECTOMY AND ADENOIDECTOMY    . TUBAL LIGATION  1985  . [place] EXTRACTION  1965    Social History   Socioeconomic History  . Marital status: Married    Spouse name: Not on file  . Number of children: Not on file  . Years of education: Not on file  . Highest education level: Not on file  Social Needs  . Financial resource strain: Not on file  . Food insecurity - worry: Not  on file  . Food insecurity - inability: Not on file  . Transportation needs - medical: Not on file  . Transportation needs - non-medical: Not on file  Occupational History  . Occupation: Retired from [employer], worked in the office  Tobacco Use  . Smoking status: Former Smoker    Types: Cigarettes    Last attempt to quit: 11/27/1975    Years since quitting: 41.6  . Smokeless tobacco: Never Used    Substance and Sexual Activity  . Alcohol use: Yes    Alcohol/week: 1.2 - 2.4 oz    Types: 2 - 4 Glasses of wine per week  . Drug use: No  . Sexual activity: Yes    Partners: Male  Other Topics Concern  . Not on file  Social History Narrative   Lives in [place] with her husband.     No Known Allergies  Family History  Problem Relation Age of Onset  . Osteoporosis Mother   . Hypertension Father   . Heart disease Father   . Hypertension Sister   . Cancer Maternal Uncle        LIVER  . Colon cancer Neg Hx   . Esophageal cancer Neg Hx   . Rectal cancer Neg Hx   . Stomach cancer Neg Hx   . Pancreatic cancer Neg Hx   . Prostate cancer Neg Hx     Prior to Admission medications   Medication Sig Start Date End Date Taking? Authorizing Provider  aspirin EC 325 MG tablet Take 325 mg by mouth once.   Yes [provider]  Docosahexaenoic Acid (DHA COMPLETE PO) Take 30 mg by mouth daily.   Yes [provider]  Multiple Vitamin (MULTIVITAMIN) capsule Take 1 capsule by mouth daily.   Yes [provider]  nebivolol (BYSTOLIC) 5 MG tablet Take 1/2 -1 tablet daily Patient taking differently: Take 2.5-5 mg by mouth See admin instructions. Take half tablet (2.5mg ) daily, take an additional 2.5mg  if heart is fluttering. 03/04/16  Yes Barrett, Rhonda G, PA-C  psyllium (REGULOID) 0.52 G capsule Take 0.52 g by mouth daily.   Yes [provider]  cephALEXin (KEFLEX) 500 MG capsule Take 1 capsule (500 mg total) by mouth 3 (three) times daily for 1 day, THEN 1 capsule (500 mg total) 2 (two) times daily for 9 days. Patient not taking: No sig reported 07/21/17 07/31/17  Troy Sine, MD    Physical Exam: Vitals:   07/21/17 1614 07/21/17 2159 07/22/17 0432 07/22/17 0449  BP: 105/72 138/62 112/63   Pulse: 70 72 75   Resp: 16     Temp: (!) 97.5 F (36.4 C) 98 F (36.7 C) 98.2 F (36.8 C)   TempSrc: Oral Oral Oral   SpO2: 97% 95% 95%   Weight:  77.6 kg (171 lb)   75.3 kg (165 lb 14.4 oz)  Height: 5' 4.5" (1.638 m)        General:  Appears calm and comfortable sitting up in bed eating in no acute distress Eyes:  PERRL, EOMI, normal lids, iris ENT:  grossly normal hearing, lips & tongue, mucous membranes of her mouth are moist and pink Neck:  no LAD, masses or thyromegaly Cardiovascular:  RRR, no m/r/g. No LE edema. Erythema swelling or tenderness and heat in the medial aspect right thigh. Extreme tenderness to touch. Pedal pulses present. Bilateral lower extremity varicosities Respiratory:  CTA bilaterally, no w/r/r. Normal respiratory effort. Abdomen:  soft, ntnd, NABS Skin:  no rash or induration seen on limited exam Musculoskeletal:  grossly normal tone BUE/BLE, good ROM, no bony abnormality Psychiatric:  grossly normal mood and affect, speech fluent and appropriate, AOx3 Neurologic:  CN 2-12 grossly intact, moves all extremities in coordinated fashion, sensation intact  Labs on Admission: I have personally reviewed following labs and imaging studies  CBC: Recent Labs  Lab 07/21/17 1631 07/22/17 0511  WBC 10.0 8.4  NEUTROABS 7.2  --   HGB 13.7 12.8  HCT 40.2 38.3  MCV 92.4 92.3  PLT 274 409   Basic Metabolic Panel: Recent Labs  Lab 07/21/17 1631  NA 141  K 3.9  CL 103  CO2 30  GLUCOSE 131*  BUN 19  CREATININE 0.90  CALCIUM 9.3   GFR: Estimated Creatinine Clearance: 59.3 mL/min (by C-G formula based on SCr of 0.9 mg/dL). Liver Function Tests: Recent Labs  Lab 07/21/17 1631  AST 24  ALT 18  ALKPHOS 73  BILITOT 0.7  PROT 7.4  ALBUMIN 3.9   No results for input(s): LIPASE, AMYLASE in the last 168 hours. No results for input(s): AMMONIA in the last 168 hours. Coagulation Profile: Recent Labs  Lab 07/21/17 1631  INR 0.97   Cardiac Enzymes: No results for input(s): CKTOTAL, CKMB, CKMBINDEX, TROPONINI in the last 168 hours. BNP (last 3 results) No results for input(s): PROBNP in the last 8760  hours. HbA1C: No results for input(s): HGBA1C in the last 72 hours. CBG: No results for input(s): GLUCAP in the last 168 hours. Lipid Profile: Recent Labs    07/22/17 0511  CHOL 174  HDL 50  LDLCALC 101*  TRIG 117  CHOLHDL 3.5   Thyroid Function Tests: No results for input(s): TSH, T4TOTAL, FREET4, T3FREE, THYROIDAB in the last 72 hours. Anemia Panel: No results for input(s): VITAMINB12, FOLATE, FERRITIN, TIBC, IRON, RETICCTPCT in the last 72 hours. Urine analysis:    Component Value Date/Time   COLORURINE YELLOW 09/09/2012 1516   APPEARANCEUR CLEAR 09/09/2012 1516   LABSPEC 1.005 09/09/2012 1516   PHURINE 6.0 09/09/2012 1516   GLUCOSEU NEG 09/09/2012 1516   HGBUR NEG 09/09/2012 1516   BILIRUBINUR NEG 09/09/2012 1516   KETONESUR NEG 09/09/2012 1516   PROTEINUR NEG 09/09/2012 1516   UROBILINOGEN 0.2 09/09/2012 1516   NITRITE NEG 09/09/2012 1516   LEUKOCYTESUR NEG 09/09/2012 1516    Creatinine Clearance: Estimated Creatinine Clearance: 59.3 mL/min (by C-G formula based on SCr of 0.9 mg/dL).  Sepsis Labs: @LABRCNTIP (procalcitonin:4,lacticidven:4) )No results found for this or any previous visit (from the past 240 hour(s)).   Radiological Exams on Admission: Ct Angio Chest Pe W Or Wo Contrast  Result Date: 07/21/2017 CLINICAL DATA:  Lower extremity edema EXAM: CT ANGIOGRAPHY CHEST WITH CONTRAST TECHNIQUE: Multidetector CT imaging of the chest was performed using the standard protocol during bolus administration of intravenous contrast. Multiplanar CT image reconstructions and MIPs were obtained to evaluate the vascular anatomy. CONTRAST:  <See Chart> ISOVUE-370 IOPAMIDOL (ISOVUE-370) INJECTION 76% COMPARISON:  None. FINDINGS: Cardiovascular: There are pulmonary emboli in in the proximal right intralobar pulmonary artery with several right lower lobe segmental incompletely obstructing pulmonary emboli. No other pulmonary emboli are evident. The right ventricle to left  ventricle diameter ratio is well less than 0.9, not consistent with right heart strain. There is no appreciable thoracic aortic aneurysm or dissection. Visualized great vessels appear normal. There are foci of atherosclerotic calcification in the aorta. There is slight coronary artery calcification. Pericardium is not appreciably thickened. There is no pericardial  effusion. Mediastinum/Nodes: Thyroid appears unremarkable. There is no appreciable thoracic adenopathy. There is a small hiatal hernia. Lungs/Pleura: There is atelectatic change in the left lower lobe. There is no edema or consolidation. No pleural effusion or pleural thickening evident. Upper Abdomen: Visualized upper abdominal structures appear unremarkable except for an 8 mm apparent cyst in the left lobe of the liver. Musculoskeletal: There are no blastic or lytic bone lesions. Review of the MIP images confirms the above findings. IMPRESSION: 1. Incompletely obstructing pulmonary emboli in the right intralobar pulmonary artery and in several right lower lobe pulmonary artery segments. No more central pulmonary embolus evident. No right heart strain. 2. There are foci of atherosclerotic calcification. Mild coronary artery calcification noted. 3.  No edema or consolidation.  Left lower lobe atelectasis. 4.  No evident adenopathy. 5.  Small hiatal hernia. Aortic Atherosclerosis (ICD10-I70.0). Critical Value/emergent results were called by telephone at the time of interpretation on 07/21/2017 at 9:55 pm to Dr. Teena Dunk, Cardiology, who verbally acknowledged these results. Electronically Signed   By: Lowella Grip III M.D.   On: 07/21/2017 21:55    EKG:   Assessment/Plan Principal Problem:   Acute DVT (deep venous thrombosis) (HCC) Active Problems:   Pulmonary embolus (HCC)   APC (atrial premature contractions)   #1. Acute DVT. Bilateral. Right greater than left. Doppler done in Dr. Evette Georges office Lower extremity venous Doppler studies done in  the office immediately following my examination confirm evidence for acute DVT in the right common femoral vein, femoral vein, popliteal vein, posterior tibial vein, peroneal vein, and gastrocnemius vein. There is also evidence for acute superficial thrombosis of the varicosities and other superficial veins and great saphenous vein. On the left. There is acute DVT in the gastrocnemius vein, posterior tibial vein, and peroneal vein. -Continue heparin drip per pharmacy -supportive therapy -will plan to transition to po when appropriate  #2. Pulmonary embolus. CT angio chest reveals right lung PE no heart strain. See above. No hypoxia or tachycardia -Heparin as noted above -Obtain an echocardiogram -Patient already has vascular/vein referral -monitor  3. Palpitations. Patient with a history of PACs. Treated with bystolic. No palpitations. Telemetry with NSR -continue home meds    DVT prophylaxis: heparin gtt  Code Status: full  Family Communication: husband at bedside  Disposition Plan: home when ready  Consults called: pharmacy  Admission status: inpatient    Radene Gunning MD [facility]  If 7PM-7AM, please contact night-coverage [email or web address] Password [facility]  07/22/2017, 9:32 AM

## 2017-07-22 NOTE — Progress Notes (Signed)
Discussed with Hospitalist service.  Patient has no cardiac problems at present and admitted with extensive DVT and cellulitis.  They will take patient on [facility] service.

## 2017-07-22 NOTE — Progress Notes (Signed)
  Echocardiogram 2D Echocardiogram has been performed.  Cynthia Rasmussen 07/22/2017, 3:47 PM

## 2017-07-22 NOTE — Progress Notes (Addendum)
ANTICOAGULATION CONSULT NOTE - Follow Up Consult  Pharmacy Consult for heparin Indication: PE/DVT  Labs: Recent Labs    07/21/17 1631 07/21/17 1632 07/21/17 2228  HGB 13.7  --   --   HCT 40.2  --   --   PLT 274  --   --   APTT 32  --   --   LABPROT 12.8  --   --   INR 0.97  --   --   HEPARINUNFRC  --  <0.10* 0.32  CREATININE 0.90  --   --     Assessment: 71yo female therapeutic on heparin with initial dosing for PE/DVT but at very low end of goal and would prefer higher w/ acute thrombus.  Goal of Therapy:  Heparin level 0.3-0.7 units/ml   Plan:  Will increase heparin gtt slightly to 1200 units/hr and confirm with am labs.  Wynona Neat, PharmD, BCPS  07/22/2017,12:04 AM

## 2017-07-23 DIAGNOSIS — I2690 Septic pulmonary embolism without acute cor pulmonale: Secondary | ICD-10-CM

## 2017-07-23 LAB — CBC
HCT: 39.7 % (ref 36.0–46.0)
HEMOGLOBIN: 13.6 g/dL (ref 12.0–15.0)
MCH: 31.6 pg (ref 26.0–34.0)
MCHC: 34.3 g/dL (ref 30.0–36.0)
MCV: 92.1 fL (ref 78.0–100.0)
PLATELETS: 242 10*3/uL (ref 150–400)
RBC: 4.31 MIL/uL (ref 3.87–5.11)
RDW: 13.4 % (ref 11.5–15.5)
WBC: 10.2 10*3/uL (ref 4.0–10.5)

## 2017-07-23 LAB — HEPARIN LEVEL (UNFRACTIONATED): Heparin Unfractionated: 0.4 IU/mL (ref 0.30–0.70)

## 2017-07-23 MED ORDER — ACETAMINOPHEN 325 MG PO TABS: 650.0000 mg | ORAL_TABLET | Freq: Four times a day (QID) | ORAL | Status: DC | PRN

## 2017-07-23 MED ORDER — APIXABAN 5 MG PO TABS
10.0000 mg | ORAL_TABLET | Freq: Two times a day (BID) | ORAL | Status: DC
  Administered 2017-07-23: 10 mg via ORAL
  Filled 2017-07-23: qty 2

## 2017-07-23 MED ORDER — ELIQUIS 5 MG VTE STARTER PACK: ORAL_TABLET | ORAL | 0 refills | Status: DC

## 2017-07-23 NOTE — Consult Note (Signed)
[facility] CM Primary Care Navigator  07/23/2017  RIANN OMAN [birth year] [id number]   Met with patientat the bedside to identify possible discharge needs. Patientreports having increased pain and redness to right leg/ knee that resulted to this admission. Patientendorses Dr. Shirline Frees with [facility] primary [facility].   [facility] [place] [facility].   Patient reports that shemanages her own medications at home using "pill box"systemfilled once a week.  Patient reports that she has been driving prior to admission but husband will providetransportation to herdoctors' appointments when needed after discharge.  Patient lives at home with husband who will serve as her primary caregiver if needed.   Anticipated discharge plan ishome per patient.  Patientvoiced understanding to call primary care provider's officewhen she returns homefor a post discharge follow-up appointment within 1-2 weeks or sooner if needs arise.Patient letter (with PCP's contact number) wasprovided as a reminder.  Explained to patientanddaughterabout [facility] CM services available for healthmanagement at [facility] current needs or concerns for now. Encouraged patientto seekreferralfrom primary care providerto [facility] care management if deemed necessary andappropriate for services in the future.  [facility] care management information provided for future needs thatshe may have.   Patient had opted and verbally agreed for [facility] calls to follow-up with herrecovery at home.   Referral made to [place] calls after discharge.   For additional questions please contact:  Edwena Felty A. Maejor Erven, BSN, RN-BC [facility] PRIMARY CARE Navigator Cell: [phone]

## 2017-07-23 NOTE — Progress Notes (Signed)
Pt stated Dr. Wendee Beavers wanted her to follow up with Dr. Beryle Beams at discharge. Called Dr. Azucena Freed office and they stated they would have to have formal referral before they can schedule appointment. Made Dr. Wendee Beavers aware pt would have to have formal referral for pt to follow up with hematology. Instructed to ask pt to have her PCP refer her to hematology. Carroll Kinds RN

## 2017-07-23 NOTE — Progress Notes (Signed)
Physician Discharge Summary  Cynthia Rasmussen QVZ:[id number] DOB: 07/27/1946 DOA: 07/21/2017  PCP: Shirline Frees, MD  Admit date: 07/21/2017 Discharge date: 07/23/2017  Time spent: > 35 minutes  Recommendations for Outpatient Follow-up:  1. Please ensure patient follows up with a hematologist for further evaluation and recommendations regarding her new PE and DVT   Discharge Diagnoses:  Principal Problem:   Acute DVT (deep venous thrombosis) ([place]) -  Pt started on Eliquis - She reports being more sedentary within the last month secondary to knee pain she is seeing an orthopaedic surgeon for.  Active Problems:   Pulmonary embolus ([place]) - Please see CT scan listed below.    APC (atrial premature contractions) - continue nebivolol as recommended prior to admission   Discharge Condition: stable  Diet recommendation: Heart healthy  Filed Weights   07/21/17 1614 07/22/17 0449  Weight: 77.6 kg (171 lb) 75.3 kg (165 lb 14.4 oz)    History of present illness:  71 y/o with history of APC's who presented to the hospital with new diagnosis of PE and DVT. Pt reports being more sedentary since her new onset of knee pain within the last month.  Hospital Course:  Principal Problem:   Acute DVT (deep venous thrombosis) ([facility]) -  Pt started on Eliquis - She reports being more sedentary within the last month secondary to knee pain she is seeing an orthopaedic surgeon for.  Active Problems:   Pulmonary embolus ([place]) - Please see CT scan listed below.    APC (atrial premature contractions) - continue nebivolol as recommended prior to admission  Procedures:  CT angiogram  Consultations:  none  Discharge Exam: Vitals:   07/22/17 1436 07/22/17 2136  BP: 104/66 115/61  Pulse: 77 79  Resp:    Temp: 98.2 F (36.8 C) 99 F (37.2 C)  SpO2: 95% 94%    General: pt in nad, alert and awake Cardiovascular: rrr, no rubs Respiratory: no increased wob, no wheezes  Discharge  Instructions   Discharge Instructions    Call MD for:  extreme fatigue   Complete by:  As directed    Call MD for:  severe uncontrolled pain   Complete by:  As directed    Call MD for:  temperature >100.4   Complete by:  As directed    Diet - low sodium heart healthy   Complete by:  As directed    Discharge instructions   Complete by:  As directed    Please take Eliquis as recommended and follow up with hematologist for further evaluation and recommendations.   Increase activity slowly   Complete by:  As directed      Allergies as of 07/23/2017   No Known Allergies     Medication List    STOP taking these medications   aspirin EC 325 MG tablet   cephALEXin 500 MG capsule Commonly known as:  KEFLEX   DHA COMPLETE PO   psyllium 0.52 g capsule Commonly known as:  REGULOID     TAKE these medications   acetaminophen 325 MG tablet Commonly known as:  TYLENOL Take 2 tablets (650 mg total) by mouth every 6 (six) hours as needed for mild pain or headache.   ELIQUIS STARTER PACK 5 MG Tabs Take as directed on package: start with two-5mg  tablets twice daily for 7 days. On day 8, switch to one-5mg  tablet twice daily.   multivitamin capsule Take 1 capsule by mouth daily.   nebivolol 5 MG tablet Commonly known as:  BYSTOLIC Take 1/2 -1 tablet daily What changed:    how much to take  how to take this  when to take this  additional instructions      No Known Allergies    The results of significant diagnostics from this hospitalization (including imaging, microbiology, ancillary and laboratory) are listed below for reference.    Significant Diagnostic Studies: Ct Angio Chest Pe W Or Wo Contrast  Result Date: 07/21/2017 CLINICAL DATA:  Lower extremity edema EXAM: CT ANGIOGRAPHY CHEST WITH CONTRAST TECHNIQUE: Multidetector CT imaging of the chest was performed using the standard protocol during bolus administration of intravenous contrast. Multiplanar CT image  reconstructions and MIPs were obtained to evaluate the vascular anatomy. CONTRAST:  <See Chart> ISOVUE-370 IOPAMIDOL (ISOVUE-370) INJECTION 76% COMPARISON:  None. FINDINGS: Cardiovascular: There are pulmonary emboli in in the proximal right intralobar pulmonary artery with several right lower lobe segmental incompletely obstructing pulmonary emboli. No other pulmonary emboli are evident. The right ventricle to left ventricle diameter ratio is well less than 0.9, not consistent with right heart strain. There is no appreciable thoracic aortic aneurysm or dissection. Visualized great vessels appear normal. There are foci of atherosclerotic calcification in the aorta. There is slight coronary artery calcification. Pericardium is not appreciably thickened. There is no pericardial effusion. Mediastinum/Nodes: Thyroid appears unremarkable. There is no appreciable thoracic adenopathy. There is a small hiatal hernia. Lungs/Pleura: There is atelectatic change in the left lower lobe. There is no edema or consolidation. No pleural effusion or pleural thickening evident. Upper Abdomen: Visualized upper abdominal structures appear unremarkable except for an 8 mm apparent cyst in the left lobe of the liver. Musculoskeletal: There are no blastic or lytic bone lesions. Review of the MIP images confirms the above findings. IMPRESSION: 1. Incompletely obstructing pulmonary emboli in the right intralobar pulmonary artery and in several right lower lobe pulmonary artery segments. No more central pulmonary embolus evident. No right heart strain. 2. There are foci of atherosclerotic calcification. Mild coronary artery calcification noted. 3.  No edema or consolidation.  Left lower lobe atelectasis. 4.  No evident adenopathy. 5.  Small hiatal hernia. Aortic Atherosclerosis (ICD10-I70.0). Critical Value/emergent results were called by telephone at the time of interpretation on 07/21/2017 at 9:55 pm to Dr. Teena Dunk, Cardiology, who verbally  acknowledged these results. Electronically Signed   By: Lowella Grip III M.D.   On: 07/21/2017 21:55    Microbiology: No results found for this or any previous visit (from the past 240 hour(s)).   Labs: Basic Metabolic Panel: Recent Labs  Lab 07/21/17 1631  NA 141  K 3.9  CL 103  CO2 30  GLUCOSE 131*  BUN 19  CREATININE 0.90  CALCIUM 9.3   Liver Function Tests: Recent Labs  Lab 07/21/17 1631  AST 24  ALT 18  ALKPHOS 73  BILITOT 0.7  PROT 7.4  ALBUMIN 3.9   No results for input(s): LIPASE, AMYLASE in the last 168 hours. No results for input(s): AMMONIA in the last 168 hours. CBC: Recent Labs  Lab 07/21/17 1631 07/22/17 0511 07/23/17 0251  WBC 10.0 8.4 10.2  NEUTROABS 7.2  --   --   HGB 13.7 12.8 13.6  HCT 40.2 38.3 39.7  MCV 92.4 92.3 92.1  PLT 274 237 242   Cardiac Enzymes: No results for input(s): CKTOTAL, CKMB, CKMBINDEX, TROPONINI in the last 168 hours. BNP: BNP (last 3 results) No results for input(s): BNP in the last 8760 hours.  ProBNP (last 3 results) No results for  input(s): PROBNP in the last 8760 hours.  CBG: No results for input(s): GLUCAP in the last 168 hours.   Signed:  Velvet Bathe MD.  [facility] 07/23/2017, 1:18 PM

## 2017-07-23 NOTE — Care Management Note (Signed)
Case Management Note  Patient Details  Name: Cynthia Rasmussen MRN: [id number] Date of Birth: 03-24-1946  Subjective/Objective:     DVT/cellulitis              Action/Plan: Discharge Planning: NCM/Pharmacist spoke to pt and husband at bedside. Pt states her drug plan will start on Aug 05, 2017. She will have [employer]. Pt provided a Eliquis 30 day free trial card by Pharmacist and was educated on medication. Husband at home to assist with care.   PCP Shirline Frees MD  Expected Discharge Date:                Expected Discharge Plan:  Home/Self Care  In-House Referral:  NA  Discharge planning Services  CM Consult, Medication Assistance  Post Acute Care Choice:  NA Choice offered to:  NA  DME Arranged:  N/A DME Agency:  NA  HH Arranged:  NA HH Agency:  NA  Status of Service:  Completed, signed off  If discussed at [place] of Stay Meetings, dates discussed:    Additional Comments:  Erenest Rasher, RN 07/23/2017, 11:55 AM

## 2017-07-23 NOTE — Addendum Note (Signed)
Addended by: Ulice Brilliant T on: 07/23/2017 09:49 AM   Modules accepted: Orders

## 2017-07-23 NOTE — Discharge Instructions (Addendum)
Information on my medicine - ELIQUIS® (apixaban) ° °This medication education was reviewed with me or my healthcare representative as part of my discharge preparation.  The pharmacist that spoke with me during my hospital stay was:  °Powell, Lisa Kay, RPH ° °Why was Eliquis® prescribed for you? °Eliquis® was prescribed to treat blood clots that may have been found in the veins of your legs (deep vein thrombosis) or in your lungs (pulmonary embolism) and to reduce the risk of them occurring again. ° °What do You need to know about Eliquis® ? °The starting dose is 10 mg (two 5 mg tablets) taken TWICE daily for the FIRST SEVEN (7) DAYS,  the dose is reduced to ONE 5 mg tablet taken TWICE daily.  Eliquis® may be taken with or without food.  ° °Try to take the dose about the same time in the morning and in the evening. If you have difficulty swallowing the tablet whole please discuss with your pharmacist how to take the medication safely. ° °Take Eliquis® exactly as prescribed and DO NOT stop taking Eliquis® without talking to the doctor who prescribed the medication.  Stopping may increase your risk of developing a new blood clot.  Refill your prescription before you run out. ° °After discharge, you should have regular check-up appointments with your healthcare provider that is prescribing your Eliquis®. °   °What do you do if you miss a dose? °If a dose of ELIQUIS® is not taken at the scheduled time, take it as soon as possible on the same day and twice-daily administration should be resumed. The dose should not be doubled to make up for a missed dose. ° °Important Safety Information °A possible side effect of Eliquis® is bleeding. You should call your healthcare provider right away if you experience any of the following: °? Bleeding from an injury or your nose that does not stop. °? Unusual colored urine (red or dark brown) or unusual colored stools (red or black). °? Unusual bruising for unknown reasons. °? A serious  fall or if you hit your head (even if there is no bleeding). ° °Some medicines may interact with Eliquis® and might increase your risk of bleeding or clotting while on Eliquis®. To help avoid this, consult your healthcare provider or pharmacist prior to using any new prescription or non-prescription medications, including herbals, vitamins, non-steroidal anti-inflammatory drugs (NSAIDs) and supplements. ° °This website has more information on Eliquis® (apixaban): http://www.eliquis.com/eliquis/home ° °

## 2017-07-23 NOTE — Progress Notes (Addendum)
ANTICOAGULATION CONSULT NOTE - Follow Up Consult  Pharmacy Consult for heparin to apixaban Indication: PE/DVT  Labs: Recent Labs    07/21/17 1631  07/21/17 2228 07/22/17 0511 07/23/17 0251  HGB 13.7  --   --  12.8 13.6  HCT 40.2  --   --  38.3 39.7  PLT 274  --   --  237 242  APTT 32  --   --   --   --   LABPROT 12.8  --   --   --   --   INR 0.97  --   --   --   --   HEPARINUNFRC  --    < > 0.32 0.37 0.40  CREATININE 0.90  --   --   --   --    < > = values in this interval not displayed.    Assessment: 71yo female therapeutic on heparin with initial dosing for PE/DVT but at very low end of goal and would prefer higher w/ acute thrombus.  Goal of Therapy:  Heparin level 0.3-0.7 units/ml   Plan:  Continue heparin at 1200 units / hr -- transitioning to Eliquis Eliquis 10 mg po BID x 7 days then 5 mg po BID    Thank you Anette Guarneri, PharmD [phone] 07/23/2017,9:34 AM

## 2017-07-24 NOTE — Discharge Summary (Signed)
Velvet Bathe, MD  Physician  Family Medicine  Progress Notes  Signed  Date of Service:  07/23/2017 1:18 PM          Signed      Expand All Collapse All      [] Hide copied text  [] Hover for details   Physician Discharge Summary  Cynthia Rasmussen [id number] DOB: 1945/11/25 DOA: 07/21/2017  PCP: Shirline Frees, MD  Admit date: 07/21/2017 Discharge date: 07/23/2017  Time spent: > 35 minutes  Recommendations for Outpatient Follow-up:  1. Please ensure patient follows up with a hematologist for further evaluation and recommendations regarding her new PE and DVT   Discharge Diagnoses:  Principal Problem:   Acute DVT (deep venous thrombosis) ([place]) -  Pt started on Eliquis - She reports being more sedentary within the last month secondary to knee pain she is seeing an orthopaedic surgeon for.  Active Problems:   Pulmonary embolus ([place]) - Please see CT scan listed below.    APC (atrial premature contractions) - continue nebivolol as recommended prior to admission   Discharge Condition: stable  Diet recommendation: Heart healthy      Filed Weights   07/21/17 1614 07/22/17 0449  Weight: 77.6 kg (171 lb) 75.3 kg (165 lb 14.4 oz)    History of present illness:  71 y/o with history of APC's who presented to the hospital with new diagnosis of PE and DVT. Pt reports being more sedentary since her new onset of knee pain within the last month.  Hospital Course:  Principal Problem:   Acute DVT (deep venous thrombosis) ([facility]) -  Pt started on Eliquis - She reports being more sedentary within the last month secondary to knee pain she is seeing an orthopaedic surgeon for.  Active Problems:   Pulmonary embolus ([place]) - Please see CT scan listed below.    APC (atrial premature contractions) - continue nebivolol as recommended prior to admission  Procedures:  CT angiogram  Consultations:  none  Discharge Exam: Vitals:   07/22/17 1436  07/22/17 2136  BP: 104/66 115/61  Pulse: 77 79  Resp:    Temp: 98.2 F (36.8 C) 99 F (37.2 C)  SpO2: 95% 94%    General: pt in nad, alert and awake Cardiovascular: rrr, no rubs Respiratory: no increased wob, no wheezes  Discharge Instructions       Discharge Instructions    Call MD for:  extreme fatigue   Complete by:  As directed    Call MD for:  severe uncontrolled pain   Complete by:  As directed    Call MD for:  temperature >100.4   Complete by:  As directed    Diet - low sodium heart healthy   Complete by:  As directed    Discharge instructions   Complete by:  As directed    Please take Eliquis as recommended and follow up with hematologist for further evaluation and recommendations.   Increase activity slowly   Complete by:  As directed      Allergies as of 07/23/2017   No Known Allergies             Medication List     STOP taking these medications   aspirin EC 325 MG tablet   cephALEXin 500 MG capsule Commonly known as:  KEFLEX   DHA COMPLETE PO   psyllium 0.52 g capsule Commonly known as:  REGULOID     TAKE these medications   acetaminophen 325 MG tablet Commonly known as:  TYLENOL Take 2 tablets (650 mg total) by mouth every 6 (six) hours as needed for mild pain or headache.   ELIQUIS STARTER PACK 5 MG Tabs Take as directed on package: start with two-5mg  tablets twice daily for 7 days. On day 8, switch to one-5mg  tablet twice daily.   multivitamin capsule Take 1 capsule by mouth daily.   nebivolol 5 MG tablet Commonly known as:  BYSTOLIC Take 1/2 -1 tablet daily What changed:    how much to take  how to take this  when to take this  additional instructions      No Known Allergies    The results of significant diagnostics from this hospitalization (including imaging, microbiology, ancillary and laboratory) are listed below for reference.    Significant Diagnostic  Studies:  ImagingResults  Ct Angio Chest Pe W Or Wo Contrast  Result Date: 07/21/2017 CLINICAL DATA:  Lower extremity edema EXAM: CT ANGIOGRAPHY CHEST WITH CONTRAST TECHNIQUE: Multidetector CT imaging of the chest was performed using the standard protocol during bolus administration of intravenous contrast. Multiplanar CT image reconstructions and MIPs were obtained to evaluate the vascular anatomy. CONTRAST:  <See Chart> ISOVUE-370 IOPAMIDOL (ISOVUE-370) INJECTION 76% COMPARISON:  None. FINDINGS: Cardiovascular: There are pulmonary emboli in in the proximal right intralobar pulmonary artery with several right lower lobe segmental incompletely obstructing pulmonary emboli. No other pulmonary emboli are evident. The right ventricle to left ventricle diameter ratio is well less than 0.9, not consistent with right heart strain. There is no appreciable thoracic aortic aneurysm or dissection. Visualized great vessels appear normal. There are foci of atherosclerotic calcification in the aorta. There is slight coronary artery calcification. Pericardium is not appreciably thickened. There is no pericardial effusion. Mediastinum/Nodes: Thyroid appears unremarkable. There is no appreciable thoracic adenopathy. There is a small hiatal hernia. Lungs/Pleura: There is atelectatic change in the left lower lobe. There is no edema or consolidation. No pleural effusion or pleural thickening evident. Upper Abdomen: Visualized upper abdominal structures appear unremarkable except for an 8 mm apparent cyst in the left lobe of the liver. Musculoskeletal: There are no blastic or lytic bone lesions. Review of the MIP images confirms the above findings. IMPRESSION: 1. Incompletely obstructing pulmonary emboli in the right intralobar pulmonary artery and in several right lower lobe pulmonary artery segments. No more central pulmonary embolus evident. No right heart strain. 2. There are foci of atherosclerotic calcification. Mild  coronary artery calcification noted. 3.  No edema or consolidation.  Left lower lobe atelectasis. 4.  No evident adenopathy. 5.  Small hiatal hernia. Aortic Atherosclerosis (ICD10-I70.0). Critical Value/emergent results were called by telephone at the time of interpretation on 07/21/2017 at 9:55 pm to Dr. Teena Dunk, Cardiology, who verbally acknowledged these results. Electronically Signed   By: Lowella Grip III M.D.   On: 07/21/2017 21:55     Microbiology: No results found for this or any previous visit (from the past 240 hour(s)).   Labs: Basic Metabolic Panel: LastLabs  Recent Labs  Lab 07/21/17 1631  NA 141  K 3.9  CL 103  CO2 30  GLUCOSE 131*  BUN 19  CREATININE 0.90  CALCIUM 9.3     Liver Function Tests: LastLabs  Recent Labs  Lab 07/21/17 1631  AST 24  ALT 18  ALKPHOS 73  BILITOT 0.7  PROT 7.4  ALBUMIN 3.9     LastLabs  No results for input(s): LIPASE, AMYLASE in the last 168 hours.   LastLabs  No results for input(s): AMMONIA in  the last 168 hours.   CBC: LastLabs       Recent Labs  Lab 07/21/17 1631 07/22/17 0511 07/23/17 0251  WBC 10.0 8.4 10.2  NEUTROABS 7.2  --   --   HGB 13.7 12.8 13.6  HCT 40.2 38.3 39.7  MCV 92.4 92.3 92.1  PLT 274 237 242     Cardiac Enzymes: LastLabs  No results for input(s): CKTOTAL, CKMB, CKMBINDEX, TROPONINI in the last 168 hours.   BNP: BNP (last 3 results) RecentLabs(withinlast365days)  No results for input(s): BNP in the last 8760 hours.    ProBNP (last 3 results) RecentLabs(withinlast365days)  No results for input(s): PROBNP in the last 8760 hours.    CBG: LastLabs  No results for input(s): GLUCAP in the last 168 hours.     Signed:  Velvet Bathe MD.  [facility] 07/23/2017, 1:18 PM

## 2017-07-31 ENCOUNTER — Inpatient Hospital Stay (HOSPITAL_COMMUNITY): Admission: RE | Admit: 2017-07-31 | Payer: Medicare Other | Source: Ambulatory Visit

## 2017-08-06 DIAGNOSIS — I2699 Other pulmonary embolism without acute cor pulmonale: Secondary | ICD-10-CM | POA: Diagnosis not present

## 2017-08-06 DIAGNOSIS — I82493 Acute embolism and thrombosis of other specified deep vein of lower extremity, bilateral: Secondary | ICD-10-CM | POA: Diagnosis not present

## 2017-08-20 ENCOUNTER — Encounter: Payer: Medicare Other | Admitting: Surgery

## 2017-08-25 ENCOUNTER — Ambulatory Visit (INDEPENDENT_AMBULATORY_CARE_PROVIDER_SITE_OTHER): Payer: Medicare Other | Admitting: Vascular Surgery

## 2017-08-25 ENCOUNTER — Encounter: Payer: Self-pay | Admitting: Vascular Surgery

## 2017-08-25 ENCOUNTER — Encounter: Payer: Medicare Other | Admitting: Surgery

## 2017-08-25 VITALS — BP 122/71 | HR 71 | Temp 98.0°F | Resp 18 | Ht 65.0 in | Wt 170.5 lb

## 2017-08-25 DIAGNOSIS — I83893 Varicose veins of bilateral lower extremities with other complications: Secondary | ICD-10-CM

## 2017-08-25 DIAGNOSIS — I83891 Varicose veins of right lower extremities with other complications: Secondary | ICD-10-CM | POA: Insufficient documentation

## 2017-08-25 DIAGNOSIS — I82503 Chronic embolism and thrombosis of unspecified deep veins of lower extremity, bilateral: Secondary | ICD-10-CM | POA: Diagnosis not present

## 2017-08-25 NOTE — Progress Notes (Signed)
Subjective:     Patient ID: Cynthia Rasmussen, female   DOB: 1946/01/17, 72 y.o.   MRN: [id number]  HPI This 72 year old female was referred by Dr. Nona Dell for evaluation of varicose veins in bilateral DVT. One month ago the patient drove about 3 hours-full and returned the next day. 2 days later she had an injection in her right knee joint. A few days later she began having right leg discomfort and early the following week she was seen in Dr. Lucy Chris office were ultrasound revealed DVT in the right leg extending up to the common femoral vein including the popliteal and tibial veins as well as thrombosed varicosities in the right thigh. She also had findings consistent with DVT in the left leg. Patient states her left leg was asymptomatic. She denied any swelling in either leg. She was treated with heparin in the hospital for 48 hours and then discharged on Eliquis which she currently takes. The discomfort in the right medial thigh which was her main symptom has improved significantly but not resolved. She has had the varicosities in the right leg for many years but denied any significant varicosities in the left leg. She does not wear elastic compression stockings and continues to deny swelling. Patient states she also had findings in the hospital consistent with possible small pulmonary emboli which were asymptomatic.  Past Medical History:  Diagnosis Date  . APC (atrial premature contractions)    DR. Claiborne Billings  . DVT (deep venous thrombosis) ([place])   . Hx of echocardiogram 2010   Which showed normal systolic function as well as mitral valve E:A ratio of 0.94 with normal pulmonary artery pressures with RV pressure of 22. She did have evidence of mild thickening of her mitral valve leaflefts, and mitral valve prolapse cannot be completely excluded. She did have mild mitral regurgitation. There also was moderate tricuspid regurgitation.  . Osteopenia   . Pulmonary embolus ([place])   . Thrombophlebitis    WITH  PREGNANCY    Social History   Tobacco Use  . Smoking status: Former Smoker    Types: Cigarettes    Last attempt to quit: 11/27/1975    Years since quitting: 41.7  . Smokeless tobacco: Never Used  Substance Use Topics  . Alcohol use: Yes    Alcohol/week: 1.2 - 2.4 oz    Types: 2 - 4 Glasses of wine per week    Family History  Problem Relation Age of Onset  . Osteoporosis Mother   . Hypertension Father   . Heart disease Father   . Hypertension Sister   . Cancer Maternal Uncle        LIVER  . Colon cancer Neg Hx   . Esophageal cancer Neg Hx   . Rectal cancer Neg Hx   . Stomach cancer Neg Hx   . Pancreatic cancer Neg Hx   . Prostate cancer Neg Hx     No Known Allergies   Current Outpatient Medications:  .  acetaminophen (TYLENOL) 325 MG tablet, Take 2 tablets (650 mg total) by mouth every 6 (six) hours as needed for mild pain or headache., Disp: , Rfl:  .  ELIQUIS STARTER PACK (ELIQUIS STARTER PACK) 5 MG TABS, Take as directed on package: start with two-5mg  tablets twice daily for 7 days. On day 8, switch to one-5mg  tablet twice daily., Disp: 1 each, Rfl: 0 .  Multiple Vitamin (MULTIVITAMIN) capsule, Take 1 capsule by mouth daily., Disp: , Rfl:  .  nebivolol (BYSTOLIC) 5  MG tablet, Take 1/2 -1 tablet daily (Patient taking differently: Take 2.5-5 mg by mouth See admin instructions. Take half tablet (2.5mg ) daily, take an additional 2.5mg  if heart is fluttering.), Disp: 30 tablet, Rfl: 11  Vitals:   08/25/17 1330  BP: 122/71  Pulse: 71  Resp: 18  Temp: 98 F (36.7 C)  TempSrc: Oral  SpO2: 98%  Weight: 170 lb 8 oz (77.3 kg)  Height: 5\' 5"  (1.651 m)    Body mass index is 28.37 kg/m.         Review of Systems   patient has history of cardiac arrhythmias at times but has not been on chronic anticoagulation. Also has had mild mitral valve prolapse. Has osteoarthritis right knee. Remainder of review of systems is unremarkable other than history of present  illness Objective:   Physical Exam BP 122/71 (BP Location: Left Arm, Patient Position: Sitting, Cuff Size: Normal)   Pulse 71   Temp 98 F (36.7 C) (Oral)   Resp 18   Ht 5\' 5"  (1.651 m)   Wt 170 lb 8 oz (77.3 kg)   SpO2 98%   BMI 28.37 kg/m     Gen.-alert and oriented x3 in no apparent distress HEENT normal for age Lungs no rhonchi or wheezing Cardiovascular regular rhythm no murmurs carotid pulses 3+ palpable no bruits audible Abdomen soft nontender no palpable masses Musculoskeletal free of  major deformities Skin clear -no rashes Neurologic normal Lower extremities 3+ femoral and dorsalis pedis pulses palpable bilaterally with no edema Right leg has multiple bulging varicosities in the medial thigh and calf with obvious thrombosed varicosities in the distal medial thigh which are mildly tender to palpation. No hyperpigmentation or ulceration noted. Left leg free of significant bulging varicosities  I performed a bedside SonoSite ultrasound exam today which revealed a grossly enlarged right great saphenous vein which was patent at the saphenofemoral junction down to the mid thigh where it supplied these bulging varicosities some of which were thrombosed. There was gross reflux throughout. Left leg had great saphenous vein which looked relatively normal in size with no large bulging varicosities   I reviewed the vascular lab report from 07/21/2017 which revealed left calf vein thrombosis and right extensive acute DVT from the tibial veins up to the common femoral vein with thrombosed varicosities in the distal thigh on the right.     #1 recent    episode of acute thrombosis varicosities right thigh with gross reflux right great saphenous vein #2 recent episode of simultaneous DVT right leg from tibial veins to common femoral vein and left tibial veins with possible small pulmonary emboli-07/21/2017 #3 osteoarthritis right knee with recent right knee injection          #1  long leg elastic compression stockings 20-30 mm gradient #2 elevate legs as much as possible #3 ibuprofen daily on a regular basis for pain #4 return in 3 months-I will obtain bilateral lower extremity venous reflux study to assess for any evidence of persistent DVT and also to assess for treatment of her 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 Claiborne Billings in 2 days for further follow-up

## 2017-08-28 ENCOUNTER — Inpatient Hospital Stay (HOSPITAL_COMMUNITY): Admission: RE | Admit: 2017-08-28 | Payer: Medicare Other | Source: Ambulatory Visit

## 2017-09-01 ENCOUNTER — Telehealth: Payer: Self-pay | Admitting: Cardiovascular Disease

## 2017-09-01 ENCOUNTER — Other Ambulatory Visit: Payer: Self-pay | Admitting: Cardiovascular Disease

## 2017-09-01 MED ORDER — NEBIVOLOL HCL 5 MG PO TABS: ORAL_TABLET | ORAL | 6 refills | Status: DC

## 2017-09-01 MED ORDER — NEBIVOLOL HCL 5 MG PO TABS: 2.5000 mg | ORAL_TABLET | ORAL | 6 refills | Status: DC

## 2017-09-01 NOTE — Telephone Encounter (Signed)
°*  STAT* If patient is at the pharmacy, call can be transferred to refill team.   1. Which medications need to be refilled? (please list name of each medication and dose if known) Bystolic  2. Which pharmacy/location (including street and city if local pharmacy) is medication to be sent to?[facility] [phone]  3. Do they need a 30 day or 90 day supply?30 and refills

## 2017-09-01 NOTE — Telephone Encounter (Signed)
New message     Patient calling, pharmacy has not received refill request.   *STAT* If patient is at the pharmacy, call can be transferred to refill team.   1. Which medications need to be refilled? (please list name of each medication and dose if known) Bystolic  2. Which pharmacy/location (including street and city if local pharmacy) is medication to be sent to? [facility]  3. Do they need a 30 day or 90 day supply? [place]

## 2017-09-01 NOTE — Telephone Encounter (Signed)
Rx(s) sent to pharmacy electronically.  

## 2017-09-01 NOTE — Telephone Encounter (Signed)
Refill sent to the pharmacy electronically.  

## 2017-11-24 ENCOUNTER — Encounter: Payer: Self-pay | Admitting: Vascular Surgery

## 2017-11-24 ENCOUNTER — Ambulatory Visit (HOSPITAL_COMMUNITY)
Admission: RE | Admit: 2017-11-24 | Discharge: 2017-11-24 | Disposition: A | Discharge status: Home / Self Care | Payer: Medicare Other | Source: Ambulatory Visit | Attending: Vascular Surgery | Admitting: Vascular Surgery

## 2017-11-24 ENCOUNTER — Ambulatory Visit (INDEPENDENT_AMBULATORY_CARE_PROVIDER_SITE_OTHER): Payer: Medicare Other | Admitting: Vascular Surgery

## 2017-11-24 VITALS — BP 122/76 | HR 72 | Temp 97.0°F | Resp 18 | Ht 64.0 in | Wt 167.8 lb

## 2017-11-24 DIAGNOSIS — I83813 Varicose veins of bilateral lower extremities with pain: Secondary | ICD-10-CM | POA: Insufficient documentation

## 2017-11-24 DIAGNOSIS — I83891 Varicose veins of right lower extremities with other complications: Secondary | ICD-10-CM

## 2017-11-24 DIAGNOSIS — I82411 Acute embolism and thrombosis of right femoral vein: Secondary | ICD-10-CM | POA: Diagnosis not present

## 2017-11-24 DIAGNOSIS — M7989 Other specified soft tissue disorders: Secondary | ICD-10-CM

## 2017-11-24 NOTE — Progress Notes (Signed)
Subjective:     Patient ID: Cynthia Rasmussen, female   DOB: 31-Jan-1946, 72 y.o.   MRN: [id number]  HPI This 72 year old female returns for continued follow-up regarding her varicose veins in the right leg and DVT.  She was seen by me 3 months ago.  One month prior to that she had developed an acute DVT after a 3-hour automobile trip and subsequent knee injection for osteoarthritis.  She was found to have DVT involving her tibial veins, popliteal, superficial femoral, and common femoral veins on the right as well as some possible tibial vein thrombosis on the left.  She was treated with anticoagulation.  She also had some thrombosed varicosities in the right distal thigh which were symptomatic.  She returns today for follow-up.  Has been taking Eliquis on a daily basis.  She is not complaining of swelling in the right leg at the present time but does have some heavy achy discomfort as the day progresses which is affecting her daily living.  She has tried long-leg elastic compression stockings 20-30 mm gradient as well as elevation and ibuprofen with no resolution of the problem.  Past Medical History:  Diagnosis Date  . APC (atrial premature contractions)    DR. Claiborne Billings  . DVT (deep venous thrombosis) ([place])   . Hx of echocardiogram 2010   Which showed normal systolic function as well as mitral valve E:A ratio of 0.94 with normal pulmonary artery pressures with RV pressure of 22. She did have evidence of mild thickening of her mitral valve leaflefts, and mitral valve prolapse cannot be completely excluded. She did have mild mitral regurgitation. There also was moderate tricuspid regurgitation.  . Osteopenia   . Pulmonary embolus ([place])   . Thrombophlebitis    WITH PREGNANCY    Social History   Tobacco Use  . Smoking status: Former Smoker    Types: Cigarettes    Last attempt to quit: 11/27/1975    Years since quitting: 42.0  . Smokeless tobacco: Never Used  Substance Use Topics  . Alcohol use: Yes    Alcohol/week: 1.2 - 2.4 oz    Types: 2 - 4 Glasses of wine per week    Family History  Problem Relation Age of Onset  . Osteoporosis Mother   . Hypertension Father   . Heart disease Father   . Hypertension Sister   . Cancer Maternal Uncle        LIVER  . Colon cancer Neg Hx   . Esophageal cancer Neg Hx   . Rectal cancer Neg Hx   . Stomach cancer Neg Hx   . Pancreatic cancer Neg Hx   . Prostate cancer Neg Hx     No Known Allergies   Current Outpatient Medications:  .  acetaminophen (TYLENOL) 325 MG tablet, Take 2 tablets (650 mg total) by mouth every 6 (six) hours as needed for mild pain or headache., Disp: , Rfl:  .  ELIQUIS STARTER PACK (ELIQUIS STARTER PACK) 5 MG TABS, Take as directed on package: start with two-5mg  tablets twice daily for 7 days. On day 8, switch to one-5mg  tablet twice daily., Disp: 1 each, Rfl: 0 .  Multiple Vitamin (MULTIVITAMIN) capsule, Take 1 capsule by mouth daily., Disp: , Rfl:  .  nebivolol (BYSTOLIC) 5 MG tablet, Take half tablet (2.5mg ) daily, take an additional 2.5mg  if heart is fluttering., Disp: 30 tablet, Rfl: 6  Vitals:   11/24/17 1315  BP: 122/76  Pulse: 72  Resp: 18  Temp: (!)  97 F (36.1 C)  TempSrc: Oral  SpO2: 99%  Weight: 167 lb 12.8 oz (76.1 kg)  Height: 5\' 4"  (1.626 m)    Body mass index is 28.8 kg/m.         Review of Systems Denies chest pain, dyspnea on exertion, PND, orthopnea, hemoptysis    Objective:   Physical Exam BP 122/76 (BP Location: Left Arm, Patient Position: Sitting, Cuff Size: Normal)   Pulse 72   Temp (!) 97 F (36.1 C) (Oral)   Resp 18   Ht 5\' 4"  (1.626 m)   Wt 167 lb 12.8 oz (76.1 kg)   SpO2 99%   BMI 28.80 kg/m   Well-developed well-nourished female no apparent distress alert and oriented x3 Lungs no rhonchi or wheezing Right leg with bulging varicosities beginning in the distal medial thigh and medial calf extending anteriorly into the pretibial region with trace to 1+ edema  distally.  3+ dorsalis pedis pulse palpable.  Today I ordered a venous duplex exam of the right leg which I reviewed and interpreted and performed a bedside SonoSite ultrasound exam.  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  Then to be evaluated for possible stab phlebectomy of painful varicosities We will proceed with precertification to perform this in the near future and hopefully will relieve this patient's symptoms

## 2018-01-23 DIAGNOSIS — H25013 Cortical age-related cataract, bilateral: Secondary | ICD-10-CM | POA: Diagnosis not present

## 2018-01-23 DIAGNOSIS — H35371 Puckering of macula, right eye: Secondary | ICD-10-CM | POA: Diagnosis not present

## 2018-01-23 DIAGNOSIS — H04123 Dry eye syndrome of bilateral lacrimal glands: Secondary | ICD-10-CM | POA: Diagnosis not present

## 2018-01-23 DIAGNOSIS — H43811 Vitreous degeneration, right eye: Secondary | ICD-10-CM | POA: Diagnosis not present

## 2018-02-03 DIAGNOSIS — Z86711 Personal history of pulmonary embolism: Secondary | ICD-10-CM | POA: Diagnosis not present

## 2018-03-02 ENCOUNTER — Encounter: Payer: Self-pay | Admitting: Gynecology

## 2018-03-02 ENCOUNTER — Ambulatory Visit (INDEPENDENT_AMBULATORY_CARE_PROVIDER_SITE_OTHER): Payer: Medicare Other | Admitting: Gynecology

## 2018-03-02 VITALS — BP 110/80 | Ht 64.0 in | Wt 169.0 lb

## 2018-03-02 DIAGNOSIS — N952 Postmenopausal atrophic vaginitis: Secondary | ICD-10-CM

## 2018-03-02 DIAGNOSIS — M8580 Other specified disorders of bone density and structure, unspecified site: Secondary | ICD-10-CM

## 2018-03-02 DIAGNOSIS — Z01419 Encounter for gynecological examination (general) (routine) without abnormal findings: Secondary | ICD-10-CM | POA: Diagnosis not present

## 2018-03-02 NOTE — Patient Instructions (Signed)
Follow-up in 1 year for annual exam, sooner if any issues. 

## 2018-03-02 NOTE — Progress Notes (Signed)
    Cynthia Rasmussen 28-Apr-1946 [id number]        72 y.o.  G3P0003 for breast and pelvic exam.  Former patient of Dr. Toney Rakes.  Without gynecologic complaints.  Past medical history,surgical history, problem list, medications, allergies, family history and social history were all reviewed and documented as reviewed in the EPIC chart.  ROS:  Performed with pertinent positives and negatives included in the history, assessment and plan.   Additional significant findings : None   Exam: Caryn Bee assistant Vitals:   03/02/18 1614  BP: 110/80  Weight: 169 lb (76.7 kg)  Height: 5\' 4"  (1.626 m)   Body mass index is 29.01 kg/m.  General appearance:  Normal affect, orientation and appearance. Skin: Grossly normal HEENT: Without gross lesions.  No cervical or supraclavicular adenopathy. Thyroid normal.  Lungs:  Clear without wheezing, rales or rhonchi Cardiac: RR, without RMG Abdominal:  Soft, nontender, without masses, guarding, rebound, organomegaly or hernia Breasts:  Examined lying and sitting without masses, retractions, discharge or axillary adenopathy. Pelvic:  Ext, BUS, Vagina: Normal with atrophic changes  Cervix: With atrophic changes  Uterus: Anteverted, normal size, shape and contour, midline and mobile nontender   Adnexa: Without masses or tenderness    Anus and perineum: Normal   Rectovaginal: Normal sphincter tone without palpated masses or tenderness.    Assessment/Plan:  72 y.o. G30P0003 female for breast and pelvic exam.   1. Postmenopausal/atrophic genital changes.  No significant menopausal symptoms or any bleeding. 2. Pap smear/HPV 2015.  No Pap smear done today.  No history of significant abnormal Pap smears.  Reviewed current screening guidelines and options to stop screening based on age reviewed.  At this point the patient is comfortable with the stop screening recommendations. 3. Mammography coming due in October and I reminded her to schedule this.  Breast  exam normal today. 4. Colonoscopy 2018.  Repeat at their recommended interval. 5. Osteopenia.  DEXA 07/2016 T score -2.1.  FRAX 15% / 1.3%.  Stable from prior DEXA.  Plan follow-up DEXA next year at 2-1/2-year interval. 6. Health maintenance.  No routine lab work done as patient does this elsewhere.  Follow-up 1 year, sooner as needed.   Anastasio Auerbach MD, 4:47 PM 03/02/2018

## 2018-05-05 DIAGNOSIS — Z23 Encounter for immunization: Secondary | ICD-10-CM | POA: Diagnosis not present

## 2018-06-22 ENCOUNTER — Encounter: Payer: Self-pay | Admitting: Gynecology

## 2018-06-22 DIAGNOSIS — Z1231 Encounter for screening mammogram for malignant neoplasm of breast: Secondary | ICD-10-CM | POA: Diagnosis not present

## 2018-07-17 DIAGNOSIS — H40013 Open angle with borderline findings, low risk, bilateral: Secondary | ICD-10-CM | POA: Diagnosis not present

## 2018-09-29 ENCOUNTER — Ambulatory Visit (INDEPENDENT_AMBULATORY_CARE_PROVIDER_SITE_OTHER): Payer: Medicare Other | Admitting: Physician Assistant

## 2018-09-29 ENCOUNTER — Encounter: Payer: Self-pay | Admitting: Physician Assistant

## 2018-09-29 VITALS — BP 106/58 | HR 73 | Ht 64.0 in | Wt 165.4 lb

## 2018-09-29 DIAGNOSIS — I824Y9 Acute embolism and thrombosis of unspecified deep veins of unspecified proximal lower extremity: Secondary | ICD-10-CM

## 2018-09-29 DIAGNOSIS — R002 Palpitations: Secondary | ICD-10-CM | POA: Diagnosis not present

## 2018-09-29 DIAGNOSIS — E785 Hyperlipidemia, unspecified: Secondary | ICD-10-CM

## 2018-09-29 DIAGNOSIS — Z13220 Encounter for screening for lipoid disorders: Secondary | ICD-10-CM | POA: Diagnosis not present

## 2018-09-29 MED ORDER — NEBIVOLOL HCL 5 MG PO TABS: 2.5000 mg | ORAL_TABLET | Freq: Every day | ORAL | 5 refills | Status: DC

## 2018-09-29 NOTE — Patient Instructions (Signed)
Medication Instructions:  Your physician recommends that you continue on your current medications as directed. Please refer to the Current Medication list given to you today.  If you need a refill on your cardiac medications before your next appointment, please call your pharmacy.   Lab work: You will have labs drawn today:  LIPID   If you have labs (blood work) drawn today and your tests are completely normal, you will receive your results only by: Marland Kitchen MyChart Message (if you have MyChart) OR . A paper copy in the mail If you have any lab test that is abnormal or we need to change your treatment, we will call you to review the results.  Testing/Procedures: NONE   Follow-Up: Your physician recommends that you schedule a follow-up appointment in February 2021 With Dr. Claiborne Billings. You will need to call our office in December 2020 to get this appointment scheduled   Any Other Special Instructions Will Be Listed Below (If Applicable).

## 2018-09-29 NOTE — Progress Notes (Signed)
Cardiology Office Note    Date:  09/29/2018   ID:  Cynthia Rasmussen, Cynthia Rasmussen 1946/01/09, MRN [id number]  PCP:  Shirline Frees, MD  Cardiologist:  Dr. Claiborne Billings  Chief Complaint  Patient presents with  . Follow-up    seen for Dr. Claiborne Billings. Annual visit    History of Present Illness:  Cynthia Rasmussen is a 73 y.o. female with PMH of PVCs and PACs controlled on Bystolic, history of DVT/PE and history of thrombophlebitis.  2D echo obtained in 2010 showed normal EF, early borderline diastolic dysfunction, normal PA pressure, mild MR. she was diagnosed with acute DVT and PE in December 2018.  Repeat echocardiogram obtained on 07/22/2017 showed EF 60 to 65%, grade 1 DD, no significant valvular issue.  She had a venous Doppler obtained on 11/24/2017 showed abnormal reflux noted in common femoral vein, greater saphenous vein of the groin, greater saphenous vein of the proximal thigh and mid thigh.  There was no evidence of acute DVT.  Compression stocking was recommended for lower extremity edema.  Patient presents today for cardiology office visit, she came off of Eliquis in June.  She says her PCP obtain blood work before taking her off of Eliquis, I suspect it was a d-dimer.  I am unable to see the lab result in Epic system.  She remains on Bystolic and has not had any palpitation, exertional chest pain or shortness of breath.  I will keep her on the current dose of Bystolic for the time being.  Blood pressure is borderline however she is asymptomatic without any dizziness or blurred vision.  She has no significant lower extremity edema.  She can follow-up in 1 year.  I will obtain fasting lipid panel today, her previous fasting lipid panel obtained in 2018 showed borderline elevated LDL.  I will defer other lab work to her primary care provider.  Past Medical History:  Diagnosis Date  . APC (atrial premature contractions)    DR. Claiborne Billings  . DVT (deep venous thrombosis) ([place])   . Hx of echocardiogram 2010   Which  showed normal systolic function as well as mitral valve E:A ratio of 0.94 with normal pulmonary artery pressures with RV pressure of 22. She did have evidence of mild thickening of her mitral valve leaflefts, and mitral valve prolapse cannot be completely excluded. She did have mild mitral regurgitation. There also was moderate tricuspid regurgitation.  . Osteopenia   . Pulmonary embolus ([place])   . Thrombophlebitis    WITH PREGNANCY    Past Surgical History:  Procedure Laterality Date  . bone tumors Right    x 2 benign  . COLONOSCOPY    . LEG SURGERY  1964   BENIGN BONE TUMOR; right leg  . POLYPECTOMY    . TONSILLECTOMY AND ADENOIDECTOMY    . TUBAL LIGATION  1985  . WISDOM TOOTH EXTRACTION  1965    Current Medications: Outpatient Medications Prior to Visit  Medication Sig Dispense Refill  . acetaminophen (TYLENOL) 325 MG tablet Take 2 tablets (650 mg total) by mouth every 6 (six) hours as needed for mild pain or headache.    Marland Kitchen FIBER PO Take by mouth daily.    . Multiple Vitamin (MULTIVITAMIN) capsule Take 1 capsule by mouth daily.    . nebivolol (BYSTOLIC) 5 MG tablet Take half tablet (2.5mg ) daily, take an additional 2.5mg  if heart is fluttering. 30 tablet 6   No facility-administered medications prior to visit.      Allergies:  Patient has no known allergies.   Social History   Socioeconomic History  . Marital status: Married    Spouse name: Not on file  . Number of children: Not on file  . Years of education: Not on file  . Highest education level: Not on file  Occupational History  . Occupation: Retired from [employer], worked in the office  Social Needs  . Financial resource strain: Not on file  . Food insecurity:    Worry: Not on file    Inability: Not on file  . Transportation needs:    Medical: Not on file    Non-medical: Not on file  Tobacco Use  . Smoking status: Former Smoker    Types: Cigarettes    Last attempt to quit: 11/27/1975    Years since  quitting: 42.8  . Smokeless tobacco: Never Used  Substance and Sexual Activity  . Alcohol use: Yes    Alcohol/week: 2.0 - 4.0 standard drinks    Types: 2 - 4 Glasses of wine per week  . Drug use: No  . Sexual activity: Yes    Partners: Male    Comment: INTERCOURSE AGE 64, LESS THAN 5 SEXUAL PARTNES,DES NEG  Lifestyle  . Physical activity:    Days per week: Not on file    Minutes per session: Not on file  . Stress: Not on file  Relationships  . Social connections:    Talks on phone: Not on file    Gets together: Not on file    Attends religious service: Not on file    Active member of club or organization: Not on file    Attends meetings of clubs or organizations: Not on file    Relationship status: Not on file  Other Topics Concern  . Not on file  Social History Narrative   Lives in [place] with her husband.      Family History:  The patient's family history includes Cancer in her maternal uncle; Heart disease in her father; Hypertension in her father and sister; Osteoporosis in her mother.   ROS:   Please see the history of present illness.    ROS All other systems reviewed and are negative.   PHYSICAL EXAM:   VS:  BP (!) 106/58   Pulse 73   Ht 5\' 4"  (1.626 m)   Wt 165 lb 6.4 oz (75 kg)   BMI 28.39 kg/m    GEN: Well nourished, well developed, in no acute distress  HEENT: normal  Neck: no JVD, carotid bruits, or masses Cardiac: RRR; no murmurs, rubs, or gallops,no edema  Respiratory:  clear to auscultation bilaterally, normal work of breathing GI: soft, nontender, nondistended, + BS MS: no deformity or atrophy  Skin: warm and dry, no rash Neuro:  Alert and Oriented x 3, Strength and sensation are intact Psych: euthymic mood, full affect  Wt Readings from Last 3 Encounters:  09/29/18 165 lb 6.4 oz (75 kg)  03/02/18 169 lb (76.7 kg)  11/24/17 167 lb 12.8 oz (76.1 kg)      Studies/Labs Reviewed:   EKG:  EKG is ordered today.  The ekg ordered  today demonstrates normal sinus rhythm without significant ST-T wave changes  Recent Labs: No results found for requested labs within last 8760 hours.   Lipid Panel    Component Value Date/Time   CHOL 174 07/22/2017 0511   TRIG 117 07/22/2017 0511   HDL 50 07/22/2017 0511   CHOLHDL 3.5 07/22/2017 0511   VLDL  23 07/22/2017 0511   LDLCALC 101 (H) 07/22/2017 0511    Additional studies/ records that were reviewed today include:   Echo 07/22/2017 LV EF: 60% -   65% Study Conclusions  - Left ventricle: Systolic function was normal. The estimated   ejection fraction was in the range of 60% to 65%. Wall motion was   normal; there were no regional wall motion abnormalities. Doppler   parameters are consistent with abnormal left ventricular   relaxation (grade 1 diastolic dysfunction). The E/e&' ratio is   between 8-15, suggesting indeterminate LV filling pressure. - Left atrium: The atrium was normal in size. - Right ventricle: The cavity size was normal. Wall thickness was   normal. Systolic function was normal. - Right atrium: The atrium was normal in size. - Inferior vena cava: The vessel was normal in size. The   respirophasic diameter changes were in the normal range (= 50%),   consistent with normal central venous pressure.  Impressions:  - LVEF 60-65%, normal wall thickness, normal wall motion, grade 1   DD, indeterminate LV filling pressure, normal LA size, normal   IVC, no signs of RV strain.   Venous doppler 11/24/2017 Final Interpretation: Right: Abnormal reflux times were noted in the common femoral vein, great saphenous vein at the groin, great saphenous vein at the proximal thigh, great saphenous vein at the mid thigh, great saphenous vein at the prox calf, and great saphenous vein at  the mid calf. There is no evidence of acute deep vein thrombosis in the lower extremity. Left: Abnormal reflux times were noted in the common femoral vein, great saphenous vein at  the distal thigh, and great saphenous vein at the mid calf. There is no evidence of acute deep vein thrombosis in the lower extremity.  ASSESSMENT:    1. Palpitations   2. Hyperlipidemia, unspecified hyperlipidemia type   3. Deep vein thrombosis (DVT) of proximal lower extremity, unspecified chronicity, unspecified laterality (HCC)      PLAN:  In order of problems listed above:  1. Palpitation: Very well controlled on the current dose of Bystolic.  We will continue  2. Hyperlipidemia: Previous lab work in 2018 showed borderline elevated LDL, will repeat a fasting lipid panel.  Continue diet and activity  3. History of DVT/PE: Diagnosed with acute PE and DVT in December 2018.  Came off of Eliquis by June 2019    Medication Adjustments/Labs and Tests Ordered: Current medicines are reviewed at length with the patient today.  Concerns regarding medicines are outlined above.  Medication changes, Labs and Tests ordered today are listed in the Patient Instructions below. Patient Instructions  Medication Instructions:  Your physician recommends that you continue on your current medications as directed. Please refer to the Current Medication list given to you today.  If you need a refill on your cardiac medications before your next appointment, please call your pharmacy.   Lab work: You will have labs drawn today:  LIPID   If you have labs (blood work) drawn today and your tests are completely normal, you will receive your results only by: Marland Kitchen MyChart Message (if you have MyChart) OR . A paper copy in the mail If you have any lab test that is abnormal or we need to change your treatment, we will call you to review the results.  Testing/Procedures: NONE   Follow-Up: Your physician recommends that you schedule a follow-up appointment in February 2021 With Dr. Claiborne Billings. You will need to call our office in  December 2020 to get this appointment scheduled   Any Other Special Instructions Will  Be Listed Below (If Applicable).       Hilbert Corrigan, [place]  09/29/2018 8:54 AM    [address], [place], [place]  [address] Phone: [phone]; Fax: [phone]

## 2018-09-30 LAB — LIPID PANEL
CHOL/HDL RATIO: 4 ratio (ref 0.0–4.4)
Cholesterol, Total: 198 mg/dL (ref 100–199)
HDL: 49 mg/dL (ref >39)
LDL Calculated: 127 mg/dL — ABNORMAL HIGH (ref 0–99)
Triglycerides: 112 mg/dL (ref 0–149)
VLDL Cholesterol Cal: 22 mg/dL (ref 5–40)

## 2018-10-01 NOTE — Progress Notes (Signed)
The patient has been notified of the result and verbalized understanding.  All questions (if any) were answered. Jacqulynn Cadet, [place] 10/01/2018 12:10 PM

## 2018-10-02 DIAGNOSIS — Z23 Encounter for immunization: Secondary | ICD-10-CM | POA: Diagnosis not present

## 2018-11-17 DIAGNOSIS — Z86711 Personal history of pulmonary embolism: Secondary | ICD-10-CM | POA: Diagnosis not present

## 2018-11-17 DIAGNOSIS — R002 Palpitations: Secondary | ICD-10-CM | POA: Diagnosis not present

## 2018-11-17 DIAGNOSIS — Z0000 Encounter for general adult medical examination without abnormal findings: Secondary | ICD-10-CM | POA: Diagnosis not present

## 2019-01-12 DIAGNOSIS — H40013 Open angle with borderline findings, low risk, bilateral: Secondary | ICD-10-CM | POA: Diagnosis not present

## 2019-04-01 DIAGNOSIS — H4312 Vitreous hemorrhage, left eye: Secondary | ICD-10-CM | POA: Diagnosis not present

## 2019-04-07 DIAGNOSIS — H43813 Vitreous degeneration, bilateral: Secondary | ICD-10-CM | POA: Diagnosis not present

## 2019-04-07 DIAGNOSIS — H35373 Puckering of macula, bilateral: Secondary | ICD-10-CM | POA: Diagnosis not present

## 2019-04-07 DIAGNOSIS — H43391 Other vitreous opacities, right eye: Secondary | ICD-10-CM | POA: Diagnosis not present

## 2019-04-07 DIAGNOSIS — H4312 Vitreous hemorrhage, left eye: Secondary | ICD-10-CM | POA: Diagnosis not present

## 2019-04-21 DIAGNOSIS — Z23 Encounter for immunization: Secondary | ICD-10-CM | POA: Diagnosis not present

## 2019-05-12 ENCOUNTER — Encounter: Payer: Self-pay | Admitting: Gynecology

## 2019-06-24 ENCOUNTER — Encounter: Payer: Self-pay | Admitting: Gynecology

## 2019-06-24 DIAGNOSIS — Z1231 Encounter for screening mammogram for malignant neoplasm of breast: Secondary | ICD-10-CM | POA: Diagnosis not present

## 2019-08-13 DIAGNOSIS — H35371 Puckering of macula, right eye: Secondary | ICD-10-CM | POA: Diagnosis not present

## 2019-08-13 DIAGNOSIS — H40013 Open angle with borderline findings, low risk, bilateral: Secondary | ICD-10-CM | POA: Diagnosis not present

## 2019-08-13 DIAGNOSIS — H2513 Age-related nuclear cataract, bilateral: Secondary | ICD-10-CM | POA: Diagnosis not present

## 2019-08-13 DIAGNOSIS — H25013 Cortical age-related cataract, bilateral: Secondary | ICD-10-CM | POA: Diagnosis not present

## 2019-09-13 ENCOUNTER — Ambulatory Visit: Payer: Medicare Other | Attending: Internal Medicine

## 2019-09-13 DIAGNOSIS — Z23 Encounter for immunization: Secondary | ICD-10-CM

## 2019-09-13 NOTE — Progress Notes (Signed)
   Covid-19 Vaccination Clinic  Name:  Cynthia Rasmussen    MRN: [id number] DOB: Oct 27, 1945  09/13/2019  [name] was observed post Covid-19 immunization for 15 minutes without incidence. She was provided with Vaccine Information Sheet and instruction to access the V-Safe system.   [name] was instructed to call 911 with any severe reactions post vaccine: Marland Kitchen Difficulty breathing  . Swelling of your face and throat  . A fast heartbeat  . A bad rash all over your body  . Dizziness and weakness    Immunizations Administered    Name Date Dose VIS Date Route   Pfizer COVID-19 Vaccine 09/13/2019  4:01 PM 0.3 mL 07/16/2019 Intramuscular   Manufacturer: [place]   Lot: [id number]   [place]: [id number]

## 2019-10-01 ENCOUNTER — Ambulatory Visit (INDEPENDENT_AMBULATORY_CARE_PROVIDER_SITE_OTHER): Payer: Medicare Other | Admitting: Cardiovascular Disease

## 2019-10-01 ENCOUNTER — Other Ambulatory Visit: Payer: Self-pay

## 2019-10-01 ENCOUNTER — Encounter: Payer: Self-pay | Admitting: Cardiovascular Disease

## 2019-10-01 VITALS — BP 107/63 | HR 81 | Temp 97.1°F | Ht 64.0 in | Wt 139.2 lb

## 2019-10-01 DIAGNOSIS — E785 Hyperlipidemia, unspecified: Secondary | ICD-10-CM | POA: Diagnosis not present

## 2019-10-01 DIAGNOSIS — Z86718 Personal history of other venous thrombosis and embolism: Secondary | ICD-10-CM

## 2019-10-01 DIAGNOSIS — I83813 Varicose veins of bilateral lower extremities with pain: Secondary | ICD-10-CM

## 2019-10-01 DIAGNOSIS — Z79899 Other long term (current) drug therapy: Secondary | ICD-10-CM

## 2019-10-01 DIAGNOSIS — R002 Palpitations: Secondary | ICD-10-CM

## 2019-10-01 MED ORDER — NEBIVOLOL HCL 5 MG PO TABS: 2.5000 mg | ORAL_TABLET | Freq: Every day | ORAL | 5 refills | Status: DC

## 2019-10-01 NOTE — Progress Notes (Addendum)
Patient ID: Cynthia Rasmussen, female   DOB: [birth year], 74 y.o.   MRN: [id number]     HPI: Cynthia Rasmussen is a 74 y.o. female who presents to the office today for a 54 month cardiology evaluation. I last saw her in December 2018.  Cynthia Rasmussen has a history of atrial ectopy/bigeminy with APCs as well as blocked APCs that has been treated with beta blocker therapy. Initially she developed significant fatigue on Toprol but has  Tolerated low dose bystolic. A 2-D echo Doppler study in 2010 showed normal systolic function and possible early borderline diastolic dysfunction with an E/A ratio of 0.94. She had normal pulmonary pressures. She did have mild thickening of her mitral valve leaflets without definitive prolapse and had mild mitral regurgitation.  She previously had been followed by Dr. Uvaldo Rising for GYN, who is recently retired.  She has a history of osteopenia.  She also sees Dr. Azalia Bilis, for primary care.   When I last saw her in December 6160 she felt Bystolic was controlling her palpitations.  She denied any episodes of chest pain or shortness of breath.  When I last saw her, she had recently driven for 3 hours and came back the following day.  On Thursday she had undergone an injection in her knee.  For several days prior to her evaluation with me she had noticed tenderness and swelling in her right thigh.  I obtained Doppler studies immediately after my office visit which were positive for acute bilateral DVT with extensive clot in the right lower extremity extending from the gastroc anemias vein all the way up to the common femoral vein with "smoke" in the vein suggestive of early thrombus with dilation of her common femoral vein.  She also had acute left DVT.  Because of her high risk findings I recommended hospitalization, started her on IV heparin and anticoagulation with evaluation for PE.  She ultimately did well and subsequently was evaluated by Dr. 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  I have not seen her since but apparently she was evaluated last by Cynthia Rasmussen in February 2020.  She apparently was on anticoagulation for 6 months which ultimately was discontinued.  Over the past year, and the COVID-19 pandemic, she actually has felt well.  She has significantly improved her diet resulting in a 30 pound weight loss.  She also has been exercising at home.  She has an apple watch.  With her weight loss, her blood pressure has decreased.  She denies palpitations.  She was wondering if she could get off her Bystolic which she has now only been taking 2.5 mg every other day.  She presents for evaluation.  Past Medical History:  Diagnosis Date  . APC (atrial premature contractions)    DR. Claiborne Billings  . DVT (deep venous thrombosis) ([place])   . Hx of echocardiogram 2010   Which showed normal systolic function as well as mitral valve E:A ratio of 0.94 with normal pulmonary artery pressures with RV pressure of 22. She did have evidence of mild thickening of her mitral valve leaflefts, and mitral valve prolapse cannot be completely excluded. She did have mild mitral regurgitation. There also was moderate tricuspid regurgitation.  . Osteopenia   . Pulmonary embolus ([place])   . Thrombophlebitis    WITH PREGNANCY    Past Surgical History:  Procedure Laterality Date  .  bone tumors Right    x 2 benign  . COLONOSCOPY    . LEG SURGERY  1964   BENIGN BONE TUMOR; right leg  . POLYPECTOMY    . TONSILLECTOMY AND ADENOIDECTOMY    . TUBAL LIGATION  1985  . WISDOM TOOTH EXTRACTION  1965    No Known Allergies  Current Outpatient Medications  Medication Sig Dispense Refill  . acetaminophen (TYLENOL) 325 MG tablet Take 2 tablets (650 mg total) by mouth every 6 (six) hours as needed for mild pain or headache.    Marland Kitchen FIBER PO Take by mouth daily.    . Multiple Vitamin  (MULTIVITAMIN) capsule Take 1 capsule by mouth daily.    . nebivolol (BYSTOLIC) 5 MG tablet Take 0.5 tablets (2.5 mg total) by mouth daily. May take an additional 2.42m if heart is fluttering. 30 tablet 5   No current facility-administered medications for this visit.    Social History   Socioeconomic History  . Marital status: Married    Spouse name: Not on file  . Number of children: Not on file  . Years of education: Not on file  . Highest education level: Not on file  Occupational History  . 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 Smoking status: Former Smoker    Types: Cigarettes    Quit date: 11/27/1975    Years since quitting: 43.8  . Smokeless tobacco: Never Used  Substance and Sexual Activity  . Alcohol use: Yes    Alcohol/week: 2.0 - 4.0 standard drinks    Types: 2 - 4 Glasses of wine per week  . Drug use: No  . Sexual activity: Yes    Partners: Male    Comment: INTERCOURSE AGE 49, LESS THAN 5 SEXUAL PARTNES,DES NEG  Other Topics Concern  . Not on file  Social History Narrative   [place]    Social Determinants of Health   Financial Resource Strain:   . Difficulty of Paying Living Expenses: Not on file  Food Insecurity:   . Worried About RCharity fundraiserin the Last Year: Not on file  . Ran Out of Food in the Last Year: Not on file  Transportation Needs:   . Lack of Transportation (Medical): Not on file  . Lack of Transportation (Non-Medical): Not on file  Physical Activity:   . Days of Exercise per Week: Not on file  . Minutes of Exercise per Session: Not on file  Stress:   . Feeling of Stress : Not on file  Social Connections:   . Frequency of Communication with Friends and Family: Not on file  . Frequency of Social Gatherings with Friends and Family: Not on file  . Attends Religious Services: Not on file  . Active Member of Clubs or Organizations: Not on file  . Attends CTheatre managerMeetings: Not on file  . Marital Status: Not on file  Intimate Partner Violence:   . Fear of Current or Ex-Partner: Not on file  . Emotionally Abused: Not on file  . Physically Abused: Not on file  . Sexually Abused: Not on file    Family History  Problem Relation Age of Onset  . Osteoporosis Mother   . Hypertension Father   . Heart disease Father   . Hypertension Sister   . Cancer Maternal Uncle        LIVER  . Colon cancer Neg Hx   .  Esophageal cancer Neg Hx   . Rectal cancer Neg Hx   . Stomach cancer Neg Hx   . Pancreatic cancer Neg Hx   . Prostate cancer Neg Hx    Additional social history is notable that she is originally from outside of [place] area. She has 3 children ages 41 and 60, one child lives in [place], [place] and the others live in [place]. 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 She completed college. His remote tobacco history for 10 years but she quit in 1977. She does drink occasional wine.   ROS General: Negative; No fevers, chills, or night sweats; purposeful weight loss HEENT: Negative; No changes in vision or hearing, sinus congestion, difficulty swallowing Pulmonary: Negative; No cough, wheezing, shortness of breath, hemoptysis Cardiovascular: See HPI GI: Negative; No nausea, vomiting, diarrhea, or abdominal pain GU: Negative; No dysuria, hematuria, or difficulty voiding Musculoskeletal: Right knee discomfort, status post injection Hematologic/Oncology: Negative; no easy bruising, bleeding Endocrine: Negative; no heat/cold intolerance; no diabetes Neuro: Negative; no changes in balance, headaches Skin: Negative; No rashes or skin lesions Psychiatric: Negative; No behavioral problems, depression Sleep: Negative; No snoring, daytime sleepiness, hypersomnolence, bruxism, restless legs, hypnogognic hallucinations, no  cataplexy Other comprehensive 14 point system review is negative.   PE BP 107/63   Pulse 81   Temp (!) 97.1 F (36.2 C)   Ht '5\' 4"'  (1.626 m)   Wt 139 lb 3.2 oz (63.1 kg)   SpO2 98%   BMI 23.89 kg/m    Repeat blood pressure by me was 110/68  Wt Readings from Last 3 Encounters:  10/01/19 139 lb 3.2 oz (63.1 kg)  09/29/18 165 lb 6.4 oz (75 kg)  03/02/18 169 lb (76.7 kg)   General: Alert, oriented, no distress.  Skin: normal turgor, no rashes, warm and dry HEENT: Normocephalic, atraumatic. Pupils equal round and reactive to light; sclera anicteric; extraocular muscles intact;  Nose without nasal septal hypertrophy Mouth/Parynx benign; Mallinpatti scale 3 Neck: No JVD, no carotid bruits; normal carotid upstroke Lungs: clear to ausculatation and percussion; no wheezing or rales Chest wall: without tenderness to palpitation Heart: PMI not displaced, RRR, s1 s2 normal, 1/6 systolic murmur, no diastolic murmur, no rubs, gallops, thrills, or heaves Abdomen: soft, nontender; no hepatosplenomehaly, BS+; abdominal aorta nontender and not dilated by palpation. Back: no CVA tenderness Pulses 2+ Musculoskeletal: full range of motion, normal strength, no joint deformities Extremities: no tenderness; no clubbing cyanosis or edema, Homan's sign negative  Neurologic: grossly nonfocal; Cranial nerves grossly wnl Psychologic: Normal mood and affect   ECG (independently read by me): Normal sinus rhythm at 81; QTc 460; no STT changes;no ectopy  December 2019 ECG (independently read by me): Normal sinus rhythm at 69 bpm.  No ectopy.  Normal intervals.  February 2016 ECG (independently read by me): Sinus bradycardia at 48 bpm.  Normal intervals.  ECG: Sinus rhythm at 55 beats per minute. Intervals are normal.  LABS: BMP Latest Ref Rng & Units 07/21/2017 09/09/2014 05/02/2011  Glucose 65 - 99 mg/dL 131(H) 89 79  BUN 6 - 20 mg/dL 19 21 -  Creatinine 0.44 - 1.00 mg/dL 0.90 0.92 -  Sodium 135 -  145 mmol/L 141 142 -  Potassium 3.5 - 5.1 mmol/L 3.9 4.3 -  Chloride 101 - 111 mmol/L 103 105 -  CO2 22 - 32 mmol/L 30 26 -  Calcium 8.9 - 10.3 mg/dL 9.3 9.1 -  Hepatic Function Latest Ref Rng & Units 07/21/2017 09/09/2014  Total Protein 6.5 - 8.1 g/dL 7.4 6.8  Albumin 3.5 - 5.0 g/dL 3.9 4.2  AST 15 - 41 U/L 24 21  ALT 14 - 54 U/L 18 17  Alk Phosphatase 38 - 126 U/L 73 70  Total Bilirubin 0.3 - 1.2 mg/dL 0.7 0.6   CBC Latest Ref Rng & Units 07/23/2017 07/22/2017 07/21/2017  WBC 4.0 - 10.5 K/uL 10.2 8.4 10.0  Hemoglobin 12.0 - 15.0 g/dL 13.6 12.8 13.7  Hematocrit 36.0 - 46.0 % 39.7 38.3 40.2  Platelets 150 - 400 K/uL 242 237 274   Lab Results  Component Value Date   TSH 2.836 09/09/2014   Lab Results  Component Value Date   MCV 92.1 07/23/2017   MCV 92.3 07/22/2017   MCV 92.4 07/21/2017   Lipid Panel     Component Value Date/Time   CHOL 198 09/29/2018 1059   TRIG 112 09/29/2018 1059   HDL 49 09/29/2018 1059   CHOLHDL 4.0 09/29/2018 1059   CHOLHDL 3.5 07/22/2017 0511   VLDL 23 07/22/2017 0511   LDLCALC 127 (H) 09/29/2018 1059    July 21, 2017:  Lower extremity venous Doppler studies done in the office immediately following my examination confirm evidence for acute DVT in the right common femoral vein, femoral vein, popliteal vein, posterior tibial vein, peroneal vein, and gastrocnemius vein.  There is also evidence for acute superficial thrombosis of the varicosities and other superficial veins and great saphenous vein.  On the left.  There is acute DVT in the gastrocnemius vein, posterior tibial vein, and peroneal vein.  IMPRESSION:  1. Palpitations   2. Hyperlipidemia, unspecified hyperlipidemia type   3. History of deep vein thrombosis (DVT) of lower extremity   4. History of Varicose veins of both lower extremities    5. Medication management     ASSESSMENT AND PLAN: Cynthia Rasmussen is a 74 year old female who has a previous history of palpitations and  remotely was found to have a bigeminal rhythm with PACs as well as blocked PACs that has successfully been treated with Bystolic.    A remote 2D echo Doppler study in 2010 showed normal systolic function with borderline diastolic dysfunction.  She developed acute DVTs following a 3-hour automobile trip and subsequent knee injection for osteoarthritis in December 2018.  In addition she was found to have thrombosed varicosities in the right distal thigh which were symptomatic.  She had been on Eliquis anticoagulation, had tried long-leg elastic compression stockings, and subsequently was evaluated by Dr. Kellie Simmering for laser ablation.  Over the past several years she has remained stable.  She feels well today.  She has severely improved her diet and has been exercising regularly during the COVID-19 pandemic and has lost 30 pounds over the past year.  She is unaware of any recurrent palpitations and is now been taking Bystolic adjust 2.5 mg daily.  She is hoping to potentially discontinue therapy if at all possible.  Her blood pressure today is stable.  I have recommended she try weaning Bystolic and will reduce her dose to 2.5 mg every other day for 1 week and then the following week reduce her dose to every third day.  If she is unaware of recurrent palpitations and her blood pressure remains stable, she can consider at that time discontinuing therapy.  However I have recommended if she does note recurrent symptomatology to resume treatment.  She has an apple watch will be monitoring  her heart rate.  She has not had recent laboratory.  I am recommending complete set of fasting blood work.  1 year ago lipid studies revealed an LDL of 127 which was elevated.  She denies any painful lower extremities.  She is no longer on anticoagulation.  She is followed by Dr. Shirline Frees for primary care.  I will see her in 1 year for evaluation or sooner as needed.   Troy Sine, MD, [facility]  10/02/2019 11:45 AM

## 2019-10-01 NOTE — Patient Instructions (Addendum)
Medication Instructions:  WEAN OFF BYSTOLIC  *If you need a refill on your cardiac medications before your next appointment, please call your pharmacy*   Lab Work: FASTING LABS TSH CBC CMET LIPID If you have labs (blood work) drawn today and your tests are completely normal, you will receive your results only by: Marland Kitchen MyChart Message (if you have MyChart) OR . A paper copy in the mail If you have any lab test that is abnormal or we need to change your treatment, we will call you to review the results.   Follow-Up: At [facility], you and your health needs are our priority.  As part of our continuing mission to provide you with exceptional heart care, we have created designated Provider Care Teams.  These Care Teams include your primary Cardiologist (physician) and Advanced Practice Providers (APPs -  Physician Assistants and Nurse Practitioners) who all work together to provide you with the care you need, when you need it.  We recommend signing up for the patient portal called "MyChart".  Sign up information is provided on this After Visit Summary.  MyChart is used to connect with patients for Virtual Visits (Telemedicine).  Patients are able to view lab/test results, encounter notes, upcoming appointments, etc.  Non-urgent messages can be sent to your provider as well.   To learn more about what you can do with MyChart, go to [email or web address].    Your next appointment:   12 month(s)  The format for your next appointment:   In Person  Provider:   Shelva Majestic, MD

## 2019-10-02 ENCOUNTER — Encounter: Payer: Self-pay | Admitting: Cardiovascular Disease

## 2019-10-05 DIAGNOSIS — E785 Hyperlipidemia, unspecified: Secondary | ICD-10-CM | POA: Diagnosis not present

## 2019-10-05 DIAGNOSIS — R002 Palpitations: Secondary | ICD-10-CM | POA: Diagnosis not present

## 2019-10-05 LAB — TSH: TSH: 2.66 u[IU]/mL (ref 0.450–4.500)

## 2019-10-05 LAB — COMPREHENSIVE METABOLIC PANEL
ALT: 11 IU/L (ref 0–32)
AST: 16 IU/L (ref 0–40)
Albumin/Globulin Ratio: 1.8 (ref 1.2–2.2)
Albumin: 4.1 g/dL (ref 3.7–4.7)
Alkaline Phosphatase: 86 IU/L (ref 39–117)
BUN/Creatinine Ratio: 16 (ref 12–28)
BUN: 18 mg/dL (ref 8–27)
Bilirubin Total: 0.3 mg/dL (ref 0.0–1.2)
CO2: 26 mmol/L (ref 20–29)
Calcium: 9 mg/dL (ref 8.7–10.3)
Chloride: 104 mmol/L (ref 96–106)
Creatinine, Ser: 1.13 mg/dL — ABNORMAL HIGH (ref 0.57–1.00)
GFR calc Af Amer: 57 mL/min/{1.73_m2} — ABNORMAL LOW (ref >59)
GFR calc non Af Amer: 49 mL/min/{1.73_m2} — ABNORMAL LOW (ref >59)
Globulin, Total: 2.3 g/dL (ref 1.5–4.5)
Glucose: 84 mg/dL (ref 65–99)
Potassium: 5.1 mmol/L (ref 3.5–5.2)
Sodium: 143 mmol/L (ref 134–144)
Total Protein: 6.4 g/dL (ref 6.0–8.5)

## 2019-10-05 LAB — CBC
Hematocrit: 38.9 % (ref 34.0–46.6)
Hemoglobin: 13.6 g/dL (ref 11.1–15.9)
MCH: 32.2 pg (ref 26.6–33.0)
MCHC: 35 g/dL (ref 31.5–35.7)
MCV: 92 fL (ref 79–97)
Platelets: 271 10*3/uL (ref 150–450)
RBC: 4.23 x10E6/uL (ref 3.77–5.28)
RDW: 12.5 % (ref 11.7–15.4)
WBC: 6.1 10*3/uL (ref 3.4–10.8)

## 2019-10-05 LAB — LIPID PANEL
Chol/HDL Ratio: 3.1 ratio (ref 0.0–4.4)
Cholesterol, Total: 182 mg/dL (ref 100–199)
HDL: 59 mg/dL (ref >39)
LDL Chol Calc (NIH): 110 mg/dL — ABNORMAL HIGH (ref 0–99)
Triglycerides: 69 mg/dL (ref 0–149)
VLDL Cholesterol Cal: 13 mg/dL (ref 5–40)

## 2019-10-08 ENCOUNTER — Ambulatory Visit: Payer: Medicare Other | Attending: Internal Medicine

## 2019-10-08 DIAGNOSIS — Z23 Encounter for immunization: Secondary | ICD-10-CM

## 2019-10-08 NOTE — Progress Notes (Signed)
   Covid-19 Vaccination Clinic  Name:  Cynthia Rasmussen    MRN: [id number] DOB: 10/15/1945  10/08/2019  [name] was observed post Covid-19 immunization for 15 minutes without incident. She was provided with Vaccine Information Sheet and instruction to access the V-Safe system.   [name] was instructed to call 911 with any severe reactions post vaccine: Marland Kitchen Difficulty breathing  . Swelling of face and throat  . A fast heartbeat  . A bad rash all over body  . Dizziness and weakness   Immunizations Administered    Name Date Dose VIS Date Route   Pfizer COVID-19 Vaccine 10/08/2019 12:46 PM 0.3 mL 07/16/2019 Intramuscular   Manufacturer: [place]   Lot: [id number]   [place]: [id number]

## 2019-11-22 DIAGNOSIS — H40013 Open angle with borderline findings, low risk, bilateral: Secondary | ICD-10-CM | POA: Diagnosis not present

## 2020-01-19 ENCOUNTER — Other Ambulatory Visit: Payer: Self-pay

## 2020-01-20 ENCOUNTER — Ambulatory Visit (INDEPENDENT_AMBULATORY_CARE_PROVIDER_SITE_OTHER): Payer: Medicare Other | Admitting: Nurse Practitioner

## 2020-01-20 ENCOUNTER — Encounter: Payer: Self-pay | Admitting: Nurse Practitioner

## 2020-01-20 VITALS — BP 120/78 | Ht 64.0 in | Wt 137.0 lb

## 2020-01-20 DIAGNOSIS — Z01419 Encounter for gynecological examination (general) (routine) without abnormal findings: Secondary | ICD-10-CM | POA: Diagnosis not present

## 2020-01-20 DIAGNOSIS — Z780 Asymptomatic menopausal state: Secondary | ICD-10-CM

## 2020-01-20 DIAGNOSIS — M8580 Other specified disorders of bone density and structure, unspecified site: Secondary | ICD-10-CM

## 2020-01-20 NOTE — Patient Instructions (Signed)
Health Maintenance After Age 72 After age 72, you are at a higher risk for certain long-term diseases and infections as well as injuries from falls. Falls are a major cause of broken bones and head injuries in people who are older than age 72. Getting regular preventive care can help to keep you healthy and well. Preventive care includes getting regular testing and making lifestyle changes as recommended by your health care provider. Talk with your health care provider about:  Which screenings and tests you should have. A screening is a test that checks for a disease when you have no symptoms.  A diet and exercise plan that is right for you. What should I know about screenings and tests to prevent falls? Screening and testing are the best ways to find a health problem early. Early diagnosis and treatment give you the best chance of managing medical conditions that are common after age 72. Certain conditions and lifestyle choices may make you more likely to have a fall. Your health care provider may recommend:  Regular vision checks. Poor vision and conditions such as cataracts can make you more likely to have a fall. If you wear glasses, make sure to get your prescription updated if your vision changes.  Medicine review. Work with your health care provider to regularly review all of the medicines you are taking, including over-the-counter medicines. Ask your health care provider about any side effects that may make you more likely to have a fall. Tell your health care provider if any medicines that you take make you feel dizzy or sleepy.  Osteoporosis screening. Osteoporosis is a condition that causes the bones to get weaker. This can make the bones weak and cause them to break more easily.  Blood pressure screening. Blood pressure changes and medicines to control blood pressure can make you feel dizzy.  Strength and balance checks. Your health care provider may recommend certain tests to check your  strength and balance while standing, walking, or changing positions.  Foot health exam. Foot pain and numbness, as well as not wearing proper footwear, can make you more likely to have a fall.  Depression screening. You may be more likely to have a fall if you have a fear of falling, feel emotionally low, or feel unable to do activities that you used to do.  Alcohol use screening. Using too much alcohol can affect your balance and may make you more likely to have a fall. What actions can I take to lower my risk of falls? General instructions  Talk with your health care provider about your risks for falling. Tell your health care provider if: ? You fall. Be sure to tell your health care provider about all falls, even ones that seem minor. ? You feel dizzy, sleepy, or off-balance.  Take over-the-counter and prescription medicines only as told by your health care provider. These include any supplements.  Eat a healthy diet and maintain a healthy weight. A healthy diet includes low-fat dairy products, low-fat (lean) meats, and fiber from whole grains, beans, and lots of fruits and vegetables. Home safety  Remove any tripping hazards, such as rugs, cords, and clutter.  Install safety equipment such as grab bars in bathrooms and safety rails on stairs.  Keep rooms and walkways well-lit. Activity   Follow a regular exercise program to stay fit. This will help you maintain your balance. Ask your health care provider what types of exercise are appropriate for you.  If you need a cane or   walker, use it as recommended by your health care provider.  Wear supportive shoes that have nonskid soles. Lifestyle  Do not drink alcohol if your health care provider tells you not to drink.  If you drink alcohol, limit how much you have: ? 0-1 drink a day for women. ? 0-2 drinks a day for men.  Be aware of how much alcohol is in your drink. In the [place] one drink equals one typical bottle of beer (12  oz), one-half glass of wine (5 oz), or one shot of hard liquor (1 oz).  Do not use any products that contain nicotine or tobacco, such as cigarettes and e-cigarettes. If you need help quitting, ask your health care provider. Summary  Having a healthy lifestyle and getting preventive care can help to protect your health and wellness after age 72.  Screening and testing are the best way to find a health problem early and help you avoid having a fall. Early diagnosis and treatment give you the best chance for managing medical conditions that are more common for people who are older than age 72.  Falls are a major cause of broken bones and head injuries in people who are older than age 72. Take precautions to prevent a fall at home.  Work with your health care provider to learn what changes you can make to improve your health and wellness and to prevent falls. This information is not intended to replace advice given to you by your health care provider. Make sure you discuss any questions you have with your health care provider. Document Revised: 11/12/2018 Document Reviewed: 06/04/2017 Elsevier Patient Education  2020 Elsevier Inc.  

## 2020-01-20 NOTE — Progress Notes (Signed)
   Cynthia Rasmussen 26-Mar-1946 [id number]   History:  74 y.o. G3 P3 presents for breast and pelvic exam without GYN complaints. Postmenopausal - no HRT, no bleeding. History of DVT, PE, premature atrial contractions, followed by cardiology.   Gynecologic History No LMP recorded. Patient is postmenopausal.   Last Pap: 2015. No longer screening  Last mammogram: 06/14/2019. Results were: normal Last colonoscopy: 07/07/2017. Results were: polyps, 3-year repeat Last Dexa: 07/23/2016. Results were: Osteopenia, -2.1 at spine, -1.1 at right femoral neck  Past medical history, past surgical history, family history and social history were all reviewed and documented in the EPIC chart.  ROS:  A ROS was performed and pertinent positives and negatives are included.  Exam:  Vitals:   01/20/20 0907  BP: 120/78  Weight: 137 lb (62.1 kg)  Height: 5\' 4"  (1.626 m)   Body mass index is 23.52 kg/m.  General appearance:  Normal Thyroid:  Symmetrical, normal in size, without palpable masses or nodularity. Respiratory  Auscultation:  Clear without wheezing or rhonchi Cardiovascular  Auscultation:  Regular rate, without rubs, murmurs or gallops  Edema/varicosities:  Not grossly evident Abdominal  Soft,nontender, without masses, guarding or rebound.  Liver/spleen:  No organomegaly noted  Hernia:  None appreciated  Skin  Inspection:  Grossly normal   Breasts: Examined lying and sitting.   Right: Without masses, retractions, discharge or axillary adenopathy.   Left: Without masses, retractions, discharge or axillary adenopathy. Gentitourinary   Inguinal/mons:  Normal without inguinal adenopathy  External genitalia:  Normal  BUS/Urethra/Skene's glands:  Normal  Vagina:  Normal, atrophic changes  Cervix:  Normal  Uterus:  Anteverted, normal in size, shape and contour.  Midline and mobile  Adnexa/parametria:     Rt: Without masses or tenderness.   Lt: Without masses or tenderness.  Anus and  perineum: Normal  Digital rectal exam: Normal sphincter tone without palpated masses or tenderness  Assessment/Plan:  74 y.o. G3 P3 for breast and pelvic exam.   Well female exam with routine gynecological exam - Education provided on SBEs, importance of preventative screenings, current guidelines, high calcium diet, regular exercise, and multivitamin daily.   Osteopenia, spine & right femoral neck - Daily vitamin D supplement, regular exercise including weight-bearing exercises  Postmenopause - Plan: DG Bone Density. Overdue. Will schedule soon.  Follow up in 1 year for annual      [place], 9:14 AM 01/20/2020

## 2020-01-24 ENCOUNTER — Other Ambulatory Visit: Payer: Self-pay

## 2020-01-25 ENCOUNTER — Other Ambulatory Visit: Payer: Self-pay | Admitting: Nurse Practitioner

## 2020-01-25 ENCOUNTER — Ambulatory Visit (INDEPENDENT_AMBULATORY_CARE_PROVIDER_SITE_OTHER): Payer: Medicare Other

## 2020-01-25 DIAGNOSIS — M8589 Other specified disorders of bone density and structure, multiple sites: Secondary | ICD-10-CM

## 2020-01-25 DIAGNOSIS — Z780 Asymptomatic menopausal state: Secondary | ICD-10-CM | POA: Diagnosis not present

## 2020-04-12 DIAGNOSIS — Z23 Encounter for immunization: Secondary | ICD-10-CM | POA: Diagnosis not present

## 2020-05-04 DIAGNOSIS — Z23 Encounter for immunization: Secondary | ICD-10-CM | POA: Diagnosis not present

## 2020-05-23 DIAGNOSIS — H40013 Open angle with borderline findings, low risk, bilateral: Secondary | ICD-10-CM | POA: Diagnosis not present

## 2020-08-28 DIAGNOSIS — Z20822 Contact with and (suspected) exposure to covid-19: Secondary | ICD-10-CM | POA: Diagnosis not present

## 2020-10-02 ENCOUNTER — Encounter: Payer: Self-pay | Admitting: Gastroenterology

## 2020-10-05 ENCOUNTER — Encounter: Payer: Self-pay | Admitting: Cardiovascular Disease

## 2020-10-05 ENCOUNTER — Ambulatory Visit (INDEPENDENT_AMBULATORY_CARE_PROVIDER_SITE_OTHER): Payer: Medicare Other | Admitting: Cardiovascular Disease

## 2020-10-05 ENCOUNTER — Other Ambulatory Visit: Payer: Self-pay

## 2020-10-05 DIAGNOSIS — Z79899 Other long term (current) drug therapy: Secondary | ICD-10-CM

## 2020-10-05 DIAGNOSIS — R002 Palpitations: Secondary | ICD-10-CM

## 2020-10-05 DIAGNOSIS — E785 Hyperlipidemia, unspecified: Secondary | ICD-10-CM

## 2020-10-05 DIAGNOSIS — I83813 Varicose veins of bilateral lower extremities with pain: Secondary | ICD-10-CM

## 2020-10-05 DIAGNOSIS — Z86718 Personal history of other venous thrombosis and embolism: Secondary | ICD-10-CM

## 2020-10-05 NOTE — Patient Instructions (Signed)
Medication Instructions:  The current medical regimen is effective;  continue present plan and medications.  *If you need a refill on your cardiac medications before your next appointment, please call your pharmacy*   Lab Work: CMET, CBC, TSH, LIPID (come back fasting, nothing to eat or drink, no lab appointment needed)  If you have labs (blood work) drawn today and your tests are completely normal, you will receive your results only by: Marland Kitchen MyChart Message (if you have MyChart) OR . A paper copy in the mail If you have any lab test that is abnormal or we need to change your treatment, we will call you to review the results.   Follow-Up: At [facility], you and your health needs are our priority.  As part of our continuing mission to provide you with exceptional heart care, we have created designated Provider Care Teams.  These Care Teams include your primary Cardiologist (physician) and Advanced Practice Providers (APPs -  Physician Assistants and Nurse Practitioners) who all work together to provide you with the care you need, when you need it.  We recommend signing up for the patient portal called "MyChart".  Sign up information is provided on this After Visit Summary.  MyChart is used to connect with patients for Virtual Visits (Telemedicine).  Patients are able to view lab/test results, encounter notes, upcoming appointments, etc.  Non-urgent messages can be sent to your provider as well.   To learn more about what you can do with MyChart, go to [email or web address].    Your next appointment:   12 month(s)  The format for your next appointment:   In Person  Provider:   Shelva Majestic, MD

## 2020-10-05 NOTE — Progress Notes (Signed)
Patient ID: Cynthia Rasmussen, female   DOB: 07/08/1946, 75 y.o.   MRN: [id number]     PCP: Dr. Shirline Frees  HPI: Cynthia Rasmussen is a 75 y.o. female who presents to the office today for a 68  month cardiology evaluation.   Cynthia Rasmussen has a history of atrial ectopy/bigeminy with APCs as well as blocked APCs that has been treated with beta blocker therapy. Initially she developed significant fatigue on Toprol but has  Tolerated low dose bystolic. A 2-D echo Doppler study in 2010 showed normal systolic function and possible early borderline diastolic dysfunction with an E/A ratio of 0.94. She had normal pulmonary pressures. She did have mild thickening of her mitral valve leaflets without definitive prolapse and had mild mitral regurgitation.  She previously had been followed by Dr. Uvaldo Rising for GYN, who is recently retired.  She has a history of osteopenia.  She also sees Dr. Azalia Bilis, for primary care.   When I last saw her in December 4008 she felt Bystolic was controlling her palpitations.  She denied any episodes of chest pain or shortness of breath.  When I last saw her, she had recently driven for 3 hours and came back the following day.  On Thursday she had undergone an injection in her knee.  For several days prior to her evaluation with me she had noticed tenderness and swelling in her right thigh.  I obtained Doppler studies immediately after my office visit which were positive for acute bilateral DVT with extensive clot in the right lower extremity extending from the gastroc anemias vein all the way up to the common femoral vein with "smoke" in the vein suggestive of early thrombus with dilation of her common femoral vein.  She also had acute left DVT.  Because of her high risk findings I recommended hospitalization, started her on IV heparin and anticoagulation with evaluation for PE.  She ultimately did well and subsequently was evaluated by Dr. 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  \She was evaluated last by Cynthia Rasmussen in February 2020.  She apparently was on anticoagulation for 6 months which ultimately was discontinued.  I last saw her in February 2021.  Over the prior year and during the Covid pandemic she had felt well.   She significantly improved her diet resulting in a 30 pound weight loss.  She also has been exercising at home.  She has an apple watch.  With her weight loss, her blood pressure has decreased.  She denies palpitations.  She was wondering if she could get off her Bystolic which she has now only been taking 2.5 mg every other day.  During that evaluation, I recommended a slow  trial of weaning and discontinuing Bystolic.  I scheduled her for follow-up laboratory which revealed total cholesterol 182 triglycerides 69 HDL 59 and LDL 110.  Creatinine was 1.13.  LFTs were normal.  She sees Dr. Shirline Frees for her primary care.  Over the past year, she has remained stable and active.  She is completely off Bystolic and denies any awareness of palpitations.  Her blood pressure has been stable.  She is scheduled to undergo colonoscopy in April.  She does not recall having recent laboratory since last year.  She presents for reevaluation.    Past Medical History:  Diagnosis Date  . APC (atrial premature contractions)  DR. Claiborne Billings  . DVT (deep venous thrombosis) ([place])   . Hx of echocardiogram 2010   Which showed normal systolic function as well as mitral valve E:A ratio of 0.94 with normal pulmonary artery pressures with RV pressure of 22. She did have evidence of mild thickening of her mitral valve leaflefts, and mitral valve prolapse cannot be completely excluded. She did have mild mitral regurgitation. There also was moderate tricuspid regurgitation.  . Osteopenia   . Pulmonary embolus ([place])   . Thrombophlebitis    WITH PREGNANCY     Past Surgical History:  Procedure Laterality Date  . bone tumors Right    x 2 benign  . COLONOSCOPY    . LEG SURGERY  1964   BENIGN BONE TUMOR; right leg  . POLYPECTOMY    . TONSILLECTOMY AND ADENOIDECTOMY    . TUBAL LIGATION  1985  . WISDOM TOOTH EXTRACTION  1965    No Known Allergies  Current Outpatient Medications  Medication Sig Dispense Refill  . FIBER PO Take by mouth daily.    . Multiple Vitamin (MULTIVITAMIN) capsule Take 1 capsule by mouth daily.     No current facility-administered medications for this visit.    Social History   Socioeconomic History  . Marital status: Married    Spouse name: Not on file  . Number of children: Not on file  . Years of education: Not on file  . Highest education level: Not on file  Occupational History  . Occupation: Retired from [employer], worked in the office  Tobacco Use  . Smoking status: Former Smoker    Types: Cigarettes    Quit date: 11/27/1975    Years since quitting: 44.8  . Smokeless tobacco: Never Used  Vaping Use  . Vaping Use: Never used  Substance and Sexual Activity  . Alcohol use: Yes    Alcohol/week: 2.0 - 4.0 standard drinks    Types: 2 - 4 Glasses of wine per week  . Drug use: No  . Sexual activity: Yes    Partners: Male    Comment: INTERCOURSE AGE 75, LESS THAN 5 SEXUAL PARTNES,DES NEG  Other Topics Concern  . Not on file  Social History Narrative   Lives in [place] with her husband.    Social Determinants of Health   Financial Resource Strain: Not on file  Food Insecurity: Not on file  Transportation Needs: Not on file  Physical Activity: Not on file  Stress: Not on file  Social Connections: Not on file  Intimate Partner Violence: Not on file    Family History  Problem Relation Age of Onset  . Osteoporosis Mother   . Hypertension Father   . Heart disease Father   . Hypertension Sister   . Cancer Maternal Uncle        LIVER  . Colon cancer Neg Hx   . Esophageal  cancer Neg Hx   . Rectal cancer Neg Hx   . Stomach cancer Neg Hx   . Pancreatic cancer Neg Hx   . Prostate cancer Neg Hx    Additional social history is notable that she is originally from outside of [place] area. She has 3 grown children,  one child lives in [place], [place] and the others live in [place]. 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 She completed college. His remote tobacco history for 10 years but she quit in 1977. She does  drink occasional wine.   ROS General: Negative; No fevers, chills, or night sweats; purposeful weight loss HEENT: Negative; No changes in vision or hearing, sinus congestion, difficulty swallowing Pulmonary: Negative; No cough, wheezing, shortness of breath, hemoptysis Cardiovascular: See HPI GI: Negative; No nausea, vomiting, diarrhea, or abdominal pain GU: Negative; No dysuria, hematuria, or difficulty voiding Musculoskeletal: Right knee discomfort, status post injection Hematologic/Oncology: Negative; no easy bruising, bleeding Endocrine: Negative; no heat/cold intolerance; no diabetes Neuro: Negative; no changes in balance, headaches Skin: Negative; No rashes or skin lesions Psychiatric: Negative; No behavioral problems, depression Sleep: Negative; No snoring, daytime sleepiness, hypersomnolence, bruxism, restless legs, hypnogognic hallucinations, no cataplexy Other comprehensive 14 point system review is negative.   PE BP 110/66   Pulse 66   Ht '5\' 4"'  (1.626 m)   Wt 136 lb 9.6 oz (62 kg)   BMI 23.45 kg/m    Repeat blood pressure by me was 104/66  Wt Readings from Last 3 Encounters:  10/05/20 136 lb 9.6 oz (62 kg)  01/20/20 137 lb (62.1 kg)  10/01/19 139 lb 3.2 oz (63.1 kg)    General: Alert, oriented, no distress.  Skin: normal turgor, no rashes, warm and dry HEENT: Normocephalic, atraumatic. Pupils equal round and  reactive to light; sclera anicteric; extraocular muscles intact;  Nose without nasal septal hypertrophy Mouth/Parynx benign; Mallinpatti scale 3 Neck: No JVD, no carotid bruits; normal carotid upstroke Lungs: clear to ausculatation and percussion; no wheezing or rales Chest wall: without tenderness to palpitation Heart: PMI not displaced, RRR, s1 s2 normal, 1/6 systolic murmur, no diastolic murmur, no rubs, gallops, thrills, or heaves Abdomen: soft, nontender; no hepatosplenomehaly, BS+; abdominal aorta nontender and not dilated by palpation. Back: no CVA tenderness Pulses 2+ Musculoskeletal: full range of motion, normal strength, no joint deformities Extremities: Mild varicosities;no clubbing cyanosis or edema, Homan's sign negative  Neurologic: grossly nonfocal; Cranial nerves grossly wnl Psychologic: Normal mood and affect   ECG (independently read by me): NSR at 66 with mild sinus arrythmia  February 2021 ECG (independently read by me): Normal sinus rhythm at 81; QTc 460; no STT changes;no ectopy  December 2019 ECG (independently read by me): Normal sinus rhythm at 69 bpm.  No ectopy.  Normal intervals.  February 2016 ECG (independently read by me): Sinus bradycardia at 48 bpm.  Normal intervals.  ECG: Sinus rhythm at 55 beats per minute. Intervals are normal.  LABS: BMP Latest Ref Rng & Units 10/06/2020 10/05/2019 07/21/2017  Glucose 65 - 99 mg/dL 81 84 131(H)  BUN 8 - 27 mg/dL '17 18 19  ' Creatinine 0.57 - 1.00 mg/dL 0.92 1.13(H) 0.90  BUN/Creat Ratio 12 - '28 18 16 ' -  Sodium 134 - 144 mmol/L 140 143 141  Potassium 3.5 - 5.2 mmol/L 4.6 5.1 3.9  Chloride 96 - 106 mmol/L 102 104 103  CO2 20 - 29 mmol/L '25 26 30  ' Calcium 8.7 - 10.3 mg/dL 9.2 9.0 9.3   Hepatic Function Latest Ref Rng & Units 10/06/2020 10/05/2019 07/21/2017  Total Protein 6.0 - 8.5 g/dL 6.6 6.4 7.4  Albumin 3.7 - 4.7 g/dL 4.3 4.1 3.9  AST 0 - 40 IU/L '22 16 24  ' ALT 0 - 32 IU/L '15 11 18  ' Alk Phosphatase 44 - 121 IU/L  82 86 73  Total Bilirubin 0.0 - 1.2 mg/dL 0.5 0.3 0.7   CBC Latest Ref Rng & Units 10/06/2020 10/05/2019 07/23/2017  WBC 3.4 - 10.8 x10E3/uL 5.5 6.1 10.2  Hemoglobin 11.1 - 15.9 g/dL  14.7 13.6 13.6  Hematocrit 34.0 - 46.6 % 43.5 38.9 39.7  Platelets 150 - 450 x10E3/uL 276 271 242   Lab Results  Component Value Date   TSH 2.140 10/06/2020   Lab Results  Component Value Date   MCV 95 10/06/2020   MCV 92 10/05/2019   MCV 92.1 07/23/2017   Lipid Panel     Component Value Date/Time   CHOL 199 10/06/2020 0930   TRIG 72 10/06/2020 0930   HDL 67 10/06/2020 0930   CHOLHDL 3.0 10/06/2020 0930   CHOLHDL 3.5 07/22/2017 0511   VLDL 23 07/22/2017 0511   LDLCALC 119 (H) 10/06/2020 0930    July 21, 2017:  Lower extremity venous Doppler studies done in the office immediately following my examination confirm evidence for acute DVT in the right common femoral vein, femoral vein, popliteal vein, posterior tibial vein, peroneal vein, and gastrocnemius vein.  There is also evidence for acute superficial thrombosis of the varicosities and other superficial veins and great saphenous vein.  On the left.  There is acute DVT in the gastrocnemius vein, posterior tibial vein, and peroneal vein.  IMPRESSION:  1. Palpitations; resolved   2. Hyperlipidemia, unspecified hyperlipidemia type   3. History of deep vein thrombosis (DVT) of lower extremity   4. History of Varicose veins of both lower extremities    5. Medication management     ASSESSMENT AND PLAN: Cynthia Rasmussen is a 75 year old female who has a remote history of palpitations and was found to have a bigeminal rhythm with PACs as well as blocked PACs that has successfully been treated with Bystolic.    A  2D echo Doppler study in 2010 showed normal systolic function with borderline diastolic dysfunction.  She developed acute DVTs following a 3-hour automobile trip and subsequent knee injection for osteoarthritis in December 2018.  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  She had been on Eliquis anticoagulation, had tried long-leg elastic compression stockings, and subsequently was evaluated by Dr. Kellie Simmering for possible laser ablation.  At the time, Dr. Kellie Simmering was nearing retirement.  The patient never pursued this prior to his retirement.  Over the past several years she has remained stable.  She has remained active.  She is significantly made dietary adjustments and has had positive results with significant weight loss.  BMI today is excellent at 23.5.  Over the past year she was weaned off Bystolic.  Her blood pressure today is stable.  She is unaware of any palpitations or episodes of tachycardia.  Her resting pulse today is in the 60s and there is only very mild sinus arrhythmia.  She denies any painful lower extremities.  There is no significant edema.  I reviewed laboratory from 1 year ago.  LDL cholesterol was 110 which had slightly improved from previously.  Her creatinine had increased to 1.13 compared to 0.9 in 2018.  I have recommended follow-up laboratory be obtained in the fasting state.  She is no longer on anticoagulation.  She will follow up with Dr. Shirline Frees.  I will contact her regarding her laboratory.  I will see her in 1 year for reevaluation or sooner as needed.    Troy Sine, MD, [facility]  10/07/2020 10:38 AM

## 2020-10-06 DIAGNOSIS — Z79899 Other long term (current) drug therapy: Secondary | ICD-10-CM | POA: Diagnosis not present

## 2020-10-06 DIAGNOSIS — E785 Hyperlipidemia, unspecified: Secondary | ICD-10-CM | POA: Diagnosis not present

## 2020-10-06 LAB — COMPREHENSIVE METABOLIC PANEL
ALT: 15 IU/L (ref 0–32)
AST: 22 IU/L (ref 0–40)
Albumin/Globulin Ratio: 1.9 (ref 1.2–2.2)
Albumin: 4.3 g/dL (ref 3.7–4.7)
Alkaline Phosphatase: 82 IU/L (ref 44–121)
BUN/Creatinine Ratio: 18 (ref 12–28)
BUN: 17 mg/dL (ref 8–27)
Bilirubin Total: 0.5 mg/dL (ref 0.0–1.2)
CO2: 25 mmol/L (ref 20–29)
Calcium: 9.2 mg/dL (ref 8.7–10.3)
Chloride: 102 mmol/L (ref 96–106)
Creatinine, Ser: 0.92 mg/dL (ref 0.57–1.00)
Globulin, Total: 2.3 g/dL (ref 1.5–4.5)
Glucose: 81 mg/dL (ref 65–99)
Potassium: 4.6 mmol/L (ref 3.5–5.2)
Sodium: 140 mmol/L (ref 134–144)
Total Protein: 6.6 g/dL (ref 6.0–8.5)
eGFR: 66 mL/min/{1.73_m2} (ref >59)

## 2020-10-06 LAB — LIPID PANEL
Chol/HDL Ratio: 3 ratio (ref 0.0–4.4)
Cholesterol, Total: 199 mg/dL (ref 100–199)
HDL: 67 mg/dL (ref >39)
LDL Chol Calc (NIH): 119 mg/dL — ABNORMAL HIGH (ref 0–99)
Triglycerides: 72 mg/dL (ref 0–149)
VLDL Cholesterol Cal: 13 mg/dL (ref 5–40)

## 2020-10-06 LAB — CBC
Hematocrit: 43.5 % (ref 34.0–46.6)
Hemoglobin: 14.7 g/dL (ref 11.1–15.9)
MCH: 32.2 pg (ref 26.6–33.0)
MCHC: 33.8 g/dL (ref 31.5–35.7)
MCV: 95 fL (ref 79–97)
Platelets: 276 10*3/uL (ref 150–450)
RBC: 4.57 x10E6/uL (ref 3.77–5.28)
RDW: 12.8 % (ref 11.7–15.4)
WBC: 5.5 10*3/uL (ref 3.4–10.8)

## 2020-10-06 LAB — TSH: TSH: 2.14 u[IU]/mL (ref 0.450–4.500)

## 2020-10-07 ENCOUNTER — Encounter: Payer: Self-pay | Admitting: Cardiovascular Disease

## 2020-10-20 ENCOUNTER — Ambulatory Visit (AMBULATORY_SURGERY_CENTER): Payer: Self-pay

## 2020-10-20 ENCOUNTER — Other Ambulatory Visit: Payer: Self-pay

## 2020-10-20 VITALS — Ht 64.0 in | Wt 138.4 lb

## 2020-10-20 DIAGNOSIS — Z86010 Personal history of colonic polyps: Secondary | ICD-10-CM

## 2020-10-20 MED ORDER — SUPREP BOWEL PREP KIT 17.5-3.13-1.6 GM/177ML PO SOLN: 1.0000 | Freq: Once | ORAL | 0 refills | Status: AC

## 2020-10-20 NOTE — Progress Notes (Signed)

## 2020-11-08 ENCOUNTER — Encounter: Payer: Medicare Other | Admitting: Gastroenterology

## 2020-11-20 ENCOUNTER — Encounter: Payer: Self-pay | Admitting: Gastroenterology

## 2020-11-20 ENCOUNTER — Ambulatory Visit (AMBULATORY_SURGERY_CENTER): Payer: Medicare Other | Admitting: Gastroenterology

## 2020-11-20 ENCOUNTER — Other Ambulatory Visit: Payer: Self-pay

## 2020-11-20 VITALS — BP 136/57 | HR 59 | Temp 96.7°F | Resp 11 | Ht 64.0 in | Wt 138.0 lb

## 2020-11-20 DIAGNOSIS — D120 Benign neoplasm of cecum: Secondary | ICD-10-CM

## 2020-11-20 DIAGNOSIS — Z86010 Personal history of colonic polyps: Secondary | ICD-10-CM

## 2020-11-20 DIAGNOSIS — K635 Polyp of colon: Secondary | ICD-10-CM

## 2020-11-20 DIAGNOSIS — D123 Benign neoplasm of transverse colon: Secondary | ICD-10-CM

## 2020-11-20 DIAGNOSIS — Z1211 Encounter for screening for malignant neoplasm of colon: Secondary | ICD-10-CM | POA: Diagnosis not present

## 2020-11-20 MED ORDER — SODIUM CHLORIDE 0.9 % IV SOLN
500.0000 mL | INTRAVENOUS | Status: DC
Start: 2020-11-20 — End: 2020-11-20

## 2020-11-20 NOTE — Progress Notes (Signed)
A/ox3, pleased with MAC, report to RN 

## 2020-11-20 NOTE — Patient Instructions (Signed)
Handouts on polyps, diverticulosis, and hemorrhoids given to you today  Await pathology results from Dr. Havery Moros   YOU HAD AN ENDOSCOPIC PROCEDURE TODAY AT THE [facility]:   Refer to the procedure report that was given to you for any specific questions about what was found during the examination.  If the procedure report does not answer your questions, please call your gastroenterologist to clarify.  If you requested that your care partner not be given the details of your procedure findings, then the procedure report has been included in a sealed envelope for you to review at your convenience later.  YOU SHOULD EXPECT: Some feelings of bloating in the abdomen. Passage of more gas than usual.  Walking can help get rid of the air that was put into your GI tract during the procedure and reduce the bloating. If you had a lower endoscopy (such as a colonoscopy or flexible sigmoidoscopy) you may notice spotting of blood in your stool or on the toilet paper. If you underwent a bowel prep for your procedure, you may not have a normal bowel movement for a few days.  Please Note:  You might notice some irritation and congestion in your nose or some drainage.  This is from the oxygen used during your procedure.  There is no need for concern and it should clear up in a day or so.  SYMPTOMS TO REPORT IMMEDIATELY:   Following lower endoscopy (colonoscopy or flexible sigmoidoscopy):  Excessive amounts of blood in the stool  Significant tenderness or worsening of abdominal pains  Swelling of the abdomen that is new, acute  Fever of 100F or higher   For urgent or emergent issues, a gastroenterologist can be reached at any hour by calling [phone]. Do not use MyChart messaging for urgent concerns.    DIET:  We do recommend a small meal at first, but then you may proceed to your regular diet.  Drink plenty of fluids but you should avoid alcoholic beverages for 24 hours.  ACTIVITY:  You  should plan to take it easy for the rest of today and you should NOT DRIVE or use heavy machinery until tomorrow (because of the sedation medicines used during the test).    FOLLOW UP: Our staff will call the number listed on your records 48-72 hours following your procedure to check on you and address any questions or concerns that you may have regarding the information given to you following your procedure. If we do not reach you, we will leave a message.  We will attempt to reach you two times.  During this call, we will ask if you have developed any symptoms of COVID 19. If you develop any symptoms (ie: fever, flu-like symptoms, shortness of breath, cough etc.) before then, please call [phone].  If you test positive for Covid 19 in the 2 weeks post procedure, please call and report this information to [place].    If any biopsies were taken you will be contacted by phone or by letter within the next 1-3 weeks.  Please call us at [phone] if you have not heard about the biopsies in 3 weeks.    SIGNATURES/CONFIDENTIALITY: You and/or your care partner have signed paperwork which will be entered into your electronic medical record.  These signatures attest to the fact that that the information above on your After Visit Summary has been reviewed and is understood.  Full responsibility of the confidentiality of this discharge information lies with you and/or your  care-partner.

## 2020-11-20 NOTE — Progress Notes (Signed)
Pt's states no medical or surgical changes since previsit or office visit. 

## 2020-11-20 NOTE — Op Note (Signed)
[place] Patient Name: Cynthia Rasmussen Procedure Date: 11/20/2020 3:12 PM MRN: [id number] Endoscopist: Remo Lipps P. Havery Moros , MD Age: 75 Referring MD:  Date of Birth: 08/20/1945 Gender: Female Account #: [id number] Procedure:                Colonoscopy Indications:              High risk colon cancer surveillance: Personal                            history of colonic polyps (advanced adenoma removed                            in 2014, 3 sesssile serrated polyps removed in 2018) Medicines:                Monitored Anesthesia Care Procedure:                Pre-Anesthesia Assessment:                           - Prior to the procedure, a History and Physical                            was performed, and patient medications and                            allergies were reviewed. The patient's tolerance of                            previous anesthesia was also reviewed. The risks                            and benefits of the procedure and the sedation                            options and risks were discussed with the patient.                            All questions were answered, and informed consent                            was obtained. Prior Anticoagulants: The patient has                            taken no previous anticoagulant or antiplatelet                            agents. ASA Grade Assessment: II - A patient with                            mild systemic disease. After reviewing the risks                            and benefits, the patient was deemed in  satisfactory condition to undergo the procedure.                           After obtaining informed consent, the colonoscope                            was passed under direct vision. Throughout the                            procedure, the patient's blood pressure, pulse, and                            oxygen saturations were monitored continuously. The                             Olympus PCF-H190DL (#[id number]) Colonoscope was                            introduced through the anus and advanced to the the                            terminal ileum, with identification of the                            appendiceal orifice and IC valve. The colonoscopy                            was performed without difficulty. The patient                            tolerated the procedure well. The quality of the                            bowel preparation was good. The terminal ileum,                            ileocecal valve, appendiceal orifice, and rectum                            were photographed. Scope In: 3:20:27 PM Scope Out: 3:47:03 PM Scope Withdrawal Time: 0 hours 21 minutes 38 seconds  Total Procedure Duration: 0 hours 26 minutes 36 seconds  Findings:                 The perianal and digital rectal examinations were                            normal.                           The terminal ileum appeared normal.                           Two sessile polyps were found in the cecum. The  polyps were 2 to 3 mm in size. These polyps were                            removed with a cold snare. Resection and retrieval                            were complete.                           An area of altered mucosa was found at the                            ileocecal valve - normal ileal tissue at the valve                            appeared to be extending over the valve and into                            the base of the cecum. I think a normal variant but                            biopsies were taken with a cold forceps for                            histology to ensure no adenomatous change.                           Two sessile polyps were found in the transverse                            colon. The polyps were 3 mm in size. These polyps                            were removed with a cold snare. Resection and                            retrieval  were complete.                           A few small-mouthed diverticula were found in the                            sigmoid colon.                           Internal hemorrhoids were found during                            retroflexion. The hemorrhoids were small.                           The exam was otherwise without abnormality. Complications:            No immediate complications. Estimated blood  loss:                            Minimal. Estimated Blood Loss:     Estimated blood loss was minimal. Impression:               - The examined portion of the ileum was normal.                           - Two 2 to 3 mm polyps in the cecum, removed with a                            cold snare. Resected and retrieved.                           - Altered mucosa at the ileocecal valve, suspect                            normal variant. Biopsied.                           - Two 3 mm polyps in the transverse colon, removed                            with a cold snare. Resected and retrieved.                           - Diverticulosis in the sigmoid colon.                           - Internal hemorrhoids.                           - The examination was otherwise normal. Recommendation:           - Patient has a contact number available for                            emergencies. The signs and symptoms of potential                            delayed complications were discussed with the                            patient. Return to normal activities tomorrow.                            Written discharge instructions were provided to the                            patient.                           - Resume previous diet.                           -  Continue present medications.                           - Await pathology results. Remo Lipps P. Nallely Yost, MD 11/20/2020 3:53:21 PM This report has been signed electronically.

## 2020-11-22 ENCOUNTER — Telehealth: Payer: Self-pay | Admitting: *Deleted

## 2020-11-22 NOTE — Telephone Encounter (Signed)
  Follow up Call-  Call back number 11/20/2020  Post procedure Call Back phone  # [phone]  Permission to leave phone message Yes  Some recent data might be hidden     Patient questions:  Do you have a fever, pain , or abdominal swelling? No. Pain Score  0 *  Have you tolerated food without any problems? Yes.    Have you been able to return to your normal activities? Yes.    Do you have any questions about your discharge instructions: Diet   No. Medications  No. Follow up visit  No.  Do you have questions or concerns about your Care? No.  Actions: * If pain score is 4 or above: No action needed, pain <4.  1. Have you developed a fever since your procedure? no  2.   Have you had an respiratory symptoms (SOB or cough) since your procedure? no  3.   Have you tested positive for COVID 19 since your procedure no  4.   Have you had any family members/close contacts diagnosed with the COVID 19 since your procedure?  no   If yes to any of these questions please route to Joylene John, RN and Joella Prince, RN

## 2020-12-06 DIAGNOSIS — H2513 Age-related nuclear cataract, bilateral: Secondary | ICD-10-CM | POA: Diagnosis not present

## 2020-12-06 DIAGNOSIS — H3581 Retinal edema: Secondary | ICD-10-CM | POA: Diagnosis not present

## 2020-12-06 DIAGNOSIS — H43813 Vitreous degeneration, bilateral: Secondary | ICD-10-CM | POA: Diagnosis not present

## 2020-12-06 DIAGNOSIS — H35373 Puckering of macula, bilateral: Secondary | ICD-10-CM | POA: Diagnosis not present

## 2020-12-29 DIAGNOSIS — H43813 Vitreous degeneration, bilateral: Secondary | ICD-10-CM | POA: Diagnosis not present

## 2020-12-29 DIAGNOSIS — H2513 Age-related nuclear cataract, bilateral: Secondary | ICD-10-CM | POA: Diagnosis not present

## 2020-12-29 DIAGNOSIS — H25013 Cortical age-related cataract, bilateral: Secondary | ICD-10-CM | POA: Diagnosis not present

## 2020-12-29 DIAGNOSIS — H35372 Puckering of macula, left eye: Secondary | ICD-10-CM | POA: Diagnosis not present

## 2020-12-29 DIAGNOSIS — H18413 Arcus senilis, bilateral: Secondary | ICD-10-CM | POA: Diagnosis not present

## 2020-12-29 DIAGNOSIS — H2512 Age-related nuclear cataract, left eye: Secondary | ICD-10-CM | POA: Diagnosis not present

## 2020-12-29 DIAGNOSIS — H25043 Posterior subcapsular polar age-related cataract, bilateral: Secondary | ICD-10-CM | POA: Diagnosis not present

## 2020-12-29 DIAGNOSIS — H35363 Drusen (degenerative) of macula, bilateral: Secondary | ICD-10-CM | POA: Diagnosis not present

## 2021-01-11 DIAGNOSIS — Z23 Encounter for immunization: Secondary | ICD-10-CM | POA: Diagnosis not present

## 2021-02-19 DIAGNOSIS — H35372 Puckering of macula, left eye: Secondary | ICD-10-CM | POA: Diagnosis not present

## 2021-02-19 DIAGNOSIS — H2512 Age-related nuclear cataract, left eye: Secondary | ICD-10-CM | POA: Diagnosis not present

## 2021-02-19 DIAGNOSIS — H3581 Retinal edema: Secondary | ICD-10-CM | POA: Diagnosis not present

## 2021-02-20 DIAGNOSIS — H4312 Vitreous hemorrhage, left eye: Secondary | ICD-10-CM | POA: Diagnosis not present

## 2021-02-20 DIAGNOSIS — H35372 Puckering of macula, left eye: Secondary | ICD-10-CM | POA: Diagnosis not present

## 2021-02-27 DIAGNOSIS — H35372 Puckering of macula, left eye: Secondary | ICD-10-CM | POA: Diagnosis not present

## 2021-02-27 DIAGNOSIS — H3581 Retinal edema: Secondary | ICD-10-CM | POA: Diagnosis not present

## 2021-03-05 DIAGNOSIS — H2513 Age-related nuclear cataract, bilateral: Secondary | ICD-10-CM | POA: Diagnosis not present

## 2021-03-05 DIAGNOSIS — H2511 Age-related nuclear cataract, right eye: Secondary | ICD-10-CM | POA: Diagnosis not present

## 2021-03-23 DIAGNOSIS — H3581 Retinal edema: Secondary | ICD-10-CM | POA: Diagnosis not present

## 2021-03-23 DIAGNOSIS — H35373 Puckering of macula, bilateral: Secondary | ICD-10-CM | POA: Diagnosis not present

## 2021-04-13 DIAGNOSIS — H43811 Vitreous degeneration, right eye: Secondary | ICD-10-CM | POA: Diagnosis not present

## 2021-04-13 DIAGNOSIS — H35373 Puckering of macula, bilateral: Secondary | ICD-10-CM | POA: Diagnosis not present

## 2021-04-13 DIAGNOSIS — H3581 Retinal edema: Secondary | ICD-10-CM | POA: Diagnosis not present

## 2021-04-19 DIAGNOSIS — Z23 Encounter for immunization: Secondary | ICD-10-CM | POA: Diagnosis not present

## 2021-06-15 DIAGNOSIS — Z23 Encounter for immunization: Secondary | ICD-10-CM | POA: Diagnosis not present

## 2021-07-05 DIAGNOSIS — Z1231 Encounter for screening mammogram for malignant neoplasm of breast: Secondary | ICD-10-CM | POA: Diagnosis not present

## 2021-07-30 DIAGNOSIS — U071 COVID-19: Secondary | ICD-10-CM | POA: Diagnosis not present

## 2021-12-21 ENCOUNTER — Ambulatory Visit (INDEPENDENT_AMBULATORY_CARE_PROVIDER_SITE_OTHER): Payer: Medicare Other | Admitting: Cardiovascular Disease

## 2021-12-21 ENCOUNTER — Encounter: Payer: Self-pay | Admitting: Cardiovascular Disease

## 2021-12-21 VITALS — BP 122/68 | HR 71 | Ht 64.0 in | Wt 138.0 lb

## 2021-12-21 DIAGNOSIS — I82411 Acute embolism and thrombosis of right femoral vein: Secondary | ICD-10-CM | POA: Diagnosis not present

## 2021-12-21 DIAGNOSIS — R002 Palpitations: Secondary | ICD-10-CM

## 2021-12-21 DIAGNOSIS — E785 Hyperlipidemia, unspecified: Secondary | ICD-10-CM

## 2021-12-21 DIAGNOSIS — I83813 Varicose veins of bilateral lower extremities with pain: Secondary | ICD-10-CM | POA: Diagnosis not present

## 2021-12-21 NOTE — Progress Notes (Signed)
Patient ID: Cynthia Rasmussen, female   DOB: 1946/06/17, 76 y.o.   MRN: [id number]     PCP: Dr. Shirline Frees  HPI: Cynthia Rasmussen is a 76 y.o. female who presents to the office today for a 15  month cardiology evaluation.   Cynthia Rasmussen has a history of atrial ectopy/bigeminy with APCs as well as blocked APCs that has been treated with beta blocker therapy. Initially she developed significant fatigue on Toprol but has  Tolerated low dose bystolic. A 2-D echo Doppler study in 2010 showed normal systolic function and possible early borderline diastolic dysfunction with an E/A ratio of 0.94. She had normal pulmonary pressures. She did have mild thickening of her mitral valve leaflets without definitive prolapse and had mild mitral regurgitation.  She previously had been followed by Dr. Uvaldo Rising for GYN, who is recently retired.  She has a history of osteopenia.  She also sees Dr. Azalia Bilis, for primary care.   When I saw her in December 2330 she felt Bystolic was controlling her palpitations.  She denied any episodes of chest pain or shortness of breath.  When I last saw her, she had recently driven for 3 hours and came back the following day.  On Thursday she had undergone an injection in her knee.  For several days prior to her evaluation with me she had noticed tenderness and swelling in her right thigh.  I obtained Doppler studies immediately after my office visit which were positive for acute bilateral DVT with extensive clot in the right lower extremity extending from the gastroc anemias vein all the way up to the common femoral vein with "smoke" in the vein suggestive of early thrombus with dilation of her common femoral vein.  She also had acute left DVT.  Because of her high risk findings I recommended hospitalization, started her on IV heparin and anticoagulation with evaluation for PE.  She ultimately did well and subsequently was evaluated by Dr. 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  She was evaluated last by Cynthia Rasmussen.  She apparently was on anticoagulation for 6 months which ultimately was discontinued.  I saw her in February 2021.  Over the prior year and during the Covid pandemic she had felt well.   She significantly improved her diet resulting in a 30 pound weight loss.  She also has been exercising at home.  She has an apple watch.  With her weight loss, her blood pressure has decreased.  She denies palpitations.  She was wondering if she could get off her Bystolic which she has now only been taking 2.5 mg every other day.  During that evaluation, I recommended a slow  trial of weaning and discontinuing Bystolic.  I scheduled her for follow-up laboratory which revealed total cholesterol 182 triglycerides 69 HDL 59 and LDL 110.  Creatinine was 1.13.  LFTs were normal.  I last saw her on October 05, 2020.  She sees Dr. Shirline Frees for her primary care.  Over the prior year she remained stable and active.  She is completely off Bystolic and denies any awareness of palpitations.  Her blood pressure has been stable.  She is scheduled to undergo colonoscopy in April.  She does not recall having recent laboratory since last year.   Since I last saw her, she has continued to do well and specifically  denies any chest pain, palpitations, or shortness of breath.  She remains active and is exercising 5 days/week and trying to walk at least 10,000 steps.  She continues to see Dr. Kenton Kingfisher.  She has not had recent laboratory since March 2022.  At that time, LDL cholesterol was 119.  Renal function, LFTs, and CBC were stable.  She is not on any medication.  She presents for evaluation.    Past Medical History:  Diagnosis Date   Allergy    APC (atrial premature contractions)    DR. Claiborne Billings   DVT (deep venous thrombosis) (HCC)    Hx of echocardiogram 2010    Which showed normal systolic function as well as mitral valve E:A ratio of 0.94 with normal pulmonary artery pressures with RV pressure of 22. She did have evidence of mild thickening of her mitral valve leaflefts, and mitral valve prolapse cannot be completely excluded. She did have mild mitral regurgitation. There also was moderate tricuspid regurgitation.   Osteopenia    Pulmonary embolus (HCC)    Thrombophlebitis    WITH PREGNANCY    Past Surgical History:  Procedure Laterality Date   BONE TUMOR EXCISION Right    x 2 benign   COLONOSCOPY  07/2017-[facility]   LEG SURGERY  1964   BENIGN BONE TUMOR; right leg   POLYPECTOMY     TONSILLECTOMY AND ADENOIDECTOMY     TUBAL LIGATION  1985   WISDOM TOOTH EXTRACTION  1965    No Known Allergies  Current Outpatient Medications  Medication Sig Dispense Refill   FIBER PO Take by mouth daily.     Multiple Vitamin (MULTIVITAMIN) capsule Take 1 capsule by mouth daily.     No current facility-administered medications for this visit.    Social History   Socioeconomic History   Marital status: Married    Spouse name: Not on file   Number of children: Not on file   Years of education: Not on file   Highest education level: Not on file  Occupational History   Occupation: Retired from [employer], worked in the office  Tobacco Use   Smoking status: Former    Types: Cigarettes    Quit date: 11/27/1975    Years since quitting: 46.1   Smokeless tobacco: Never  Vaping Use   Vaping Use: Never used  Substance and Sexual Activity   Alcohol use: Yes    Alcohol/week: 2.0 - 4.0 standard drinks    Types: 2 - 4 Glasses of wine per week   Drug use: No   Sexual activity: Yes    Partners: Male    Comment: INTERCOURSE AGE 40, LESS THAN 5 SEXUAL PARTNES,DES NEG  Other Topics Concern   Not on file  Social History Narrative   Lives in [place] with her husband.    Social Determinants of Health   Financial Resource Strain: Not on file  Food  Insecurity: Not on file  Transportation Needs: Not on file  Physical Activity: Not on file  Stress: Not on file  Social Connections: Not on file  Intimate Partner Violence: Not on file    Family History  Problem Relation Age of Onset   Osteoporosis Mother    Hypertension Father    Heart disease Father    Hypertension Sister    Cancer Maternal Uncle        LIVER   Colon cancer Neg Hx    Esophageal cancer Neg Hx    Rectal cancer Neg Hx  Stomach cancer Neg Hx    Pancreatic cancer Neg Hx    Prostate cancer Neg Hx    Additional social history is notable that she is originally from outside of [place] area. She has 3 grown children,  one child lives in [place], [place] and the others live in [place]. 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 She completed college. His remote tobacco history for 10 years but she quit in 1977. She does drink occasional wine.   ROS General: Negative; No fevers, chills, or night sweats; purposeful weight loss HEENT: Negative; No changes in vision or hearing, sinus congestion, difficulty swallowing Pulmonary: Negative; No cough, wheezing, shortness of breath, hemoptysis Cardiovascular: See HPI GI: Negative; No nausea, vomiting, diarrhea, or abdominal pain GU: Negative; No dysuria, hematuria, or difficulty voiding Musculoskeletal: Right knee discomfort, status post injection Hematologic/Oncology: Negative; no easy bruising, bleeding Endocrine: Negative; no heat/cold intolerance; no diabetes Neuro: Negative; no changes in balance, headaches Skin: Negative; No rashes or skin lesions Psychiatric: Negative; No behavioral problems, depression Sleep: Negative; No snoring, daytime sleepiness, hypersomnolence, bruxism, restless legs, hypnogognic hallucinations, no cataplexy Other comprehensive 14 point system review is negative.   PE BP 122/68    Pulse 71   Ht _0  (1.626 m)   Wt 138 lb (62.6 kg)   SpO2 97%   BMI 23.69 kg/m    Repeat blood pressure by me was 116/68  Wt Readings from Last 3 Encounters:  12/21/21 138 lb (62.6 kg)  11/20/20 138 lb (62.6 kg)  10/20/20 138 lb 6.4 oz (62.8 kg)   General: Alert, oriented, no distress.  Skin: normal turgor, no rashes, warm and dry HEENT: Normocephalic, atraumatic. Pupils equal round and reactive to light; sclera anicteric; extraocular muscles intact; Nose without nasal septal hypertrophy Mouth/Parynx benign; Mallinpatti scale 3 Neck: No JVD, no carotid bruits; normal carotid upstroke Lungs: clear to ausculatation and percussion; no wheezing or rales Chest wall: without tenderness to palpitation Heart: PMI not displaced, RRR, s1 s2 normal, 1/6 systolic murmur, no diastolic murmur, no rubs, gallops, thrills, or heaves Abdomen: soft, nontender; no hepatosplenomehaly, BS+; abdominal aorta nontender and not dilated by palpation. Back: no CVA tenderness Pulses 2+ Musculoskeletal: full range of motion, normal strength, no joint deformities Extremities: no clubbing cyanosis or edema, Homan's sign negative  Neurologic: grossly nonfocal; Cranial nerves grossly wnl Psychologic: Normal mood and affect   Dec 21, 2021 ECG (independently read by me):  NSR at 71, no ectopy    October 05, 2020 ECG (independently read by me): NSR at 66 with mild sinus arrythmia  February 2021 ECG (independently read by me): Normal sinus rhythm at 81; QTc 460; no STT changes;no ectopy  December 2019 ECG (independently read by me): Normal sinus rhythm at 69 bpm.  No ectopy.  Normal intervals.  February 2016 ECG (independently read by me): Sinus bradycardia at 48 bpm.  Normal intervals.  ECG: Sinus rhythm at 55 beats per minute. Intervals are normal.  LABS:    Latest Ref Rng & Units 10/06/2020    9:30 AM 10/05/2019    8:54 AM 07/21/2017    4:31 PM  BMP  Glucose 65 - 99 mg/dL 81   84   131    BUN 8 - 27  mg/dL _1 Creatinine 0.57 - 1.00 mg/dL 0.92   1.13   0.90    BUN/Creat Ratio  12 - _0 Sodium 134 - 144 mmol/L 140   143   141    Potassium 3.5 - 5.2 mmol/L 4.6   5.1   3.9    Chloride 96 - 106 mmol/L 102   104   103    CO2 20 - 29 mmol/L _1 Calcium 8.7 - 10.3 mg/dL 9.2   9.0   9.3        Latest Ref Rng & Units 10/06/2020    9:30 AM 10/05/2019    8:54 AM 07/21/2017    4:31 PM  Hepatic Function  Total Protein 6.0 - 8.5 g/dL 6.6   6.4   7.4    Albumin 3.7 - 4.7 g/dL 4.3   4.1   3.9    AST 0 - 40 IU/L _2 ALT 0 - 32 IU/L _3 Alk Phosphatase 44 - 121 IU/L 82   86   73    Total Bilirubin 0.0 - 1.2 mg/dL 0.5   0.3   0.7        Latest Ref Rng & Units 10/06/2020    9:30 AM 10/05/2019    8:54 AM 07/23/2017    2:51 AM  CBC  WBC 3.4 - 10.8 x10E3/uL 5.5   6.1   10.2    Hemoglobin 11.1 - 15.9 g/dL 14.7   13.6   13.6    Hematocrit 34.0 - 46.6 % 43.5   38.9   39.7    Platelets 150 - 450 x10E3/uL 276   271   242     Lab Results  Component Value Date   TSH 2.140 10/06/2020   Lab Results  Component Value Date   MCV 95 10/06/2020   MCV 92 10/05/2019   MCV 92.1 07/23/2017   Lipid Panel     Component Value Date/Time   CHOL 199 10/06/2020 0930   TRIG 72 10/06/2020 0930   HDL 67 10/06/2020 0930   CHOLHDL 3.0 10/06/2020 0930   CHOLHDL 3.5 07/22/2017 0511   VLDL 23 07/22/2017 0511   LDLCALC 119 (H) 10/06/2020 0930    July 21, 2017:  Lower extremity venous Doppler studies done in the office immediately following my examination confirm evidence for acute DVT in the right common femoral vein, femoral vein, popliteal vein, posterior tibial vein, peroneal vein, and gastrocnemius vein.  There is also evidence for acute superficial thrombosis of the varicosities and other superficial veins and great saphenous vein.  On the left.  There is acute DVT in the gastrocnemius vein, posterior tibial vein, and peroneal vein.  IMPRESSION:  1.   Palpitations: resolved   2. Hyperlipidemia, unspecified hyperlipidemia type   3. DVT femoral (deep venous thrombosis) with thrombophlebitis, right ([place]): 2018   4. History of Varicose veins of both lower extremities      ASSESSMENT AND PLAN: Cynthia Rasmussen is a 76 year-old female who has a remote history of palpitations and was found to have a bigeminal rhythm with PACs as well as blocked PACs that was successfully been treated with Bystolic.    A  2D echo Doppler study in 2010 showed normal systolic function with borderline diastolic dysfunction.  She developed acute DVTs following a 3-hour automobile trip and subsequent knee injection for osteoarthritis in December 2018.  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  She had been on Eliquis anticoagulation, had tried long-leg elastic compression stockings, and subsequently was evaluated by Dr. Kellie Simmering for possible laser ablation.  At the time, Dr. Kellie Simmering was nearing retirement.  The patient never pursued this prior to his retirement.  Over the last several years she has remained stable and successfully was weaned off Bystolic.  Presently she is unaware of any recurrent episodes of tachycardia.  She remains active, exercises 5 days/week and tries to walk at least 10,000 steps per day.  Her blood pressure today is stable.  She has not had recent laboratory and I have recommended follow-up laboratory be obtained in the fasting state with a comprehensive metabolic panel, CBC, TSH and lipid studies.  I will contact her regarding the results.  She sees Marny Lowenstein, NP for gynecologic care and Dr. Shirline Frees for primary care.  I will contact her regarding her laboratory results.  I commended her on her activity.  I will see her in 1 year for reevaluation or sooner as needed.   Troy Sine, MD, [facility]  12/29/2021 1:20 PM

## 2021-12-21 NOTE — Patient Instructions (Signed)
Medication Instructions:  Continue same medications *If you need a refill on your cardiac medications before your next appointment, please call your pharmacy*   Lab Work: Cmet,cbc,tsh,lipid panel   Testing/Procedures: None ordered   Follow-Up: At [facility], you and your health needs are our priority.  As part of our continuing mission to provide you with exceptional heart care, we have created designated Provider Care Teams.  These Care Teams include your primary Cardiologist (physician) and Advanced Practice Providers (APPs -  Physician Assistants and Nurse Practitioners) who all work together to provide you with the care you need, when you need it.  We recommend signing up for the patient portal called "MyChart".  Sign up information is provided on this After Visit Summary.  MyChart is used to connect with patients for Virtual Visits (Telemedicine).  Patients are able to view lab/test results, encounter notes, upcoming appointments, etc.  Non-urgent messages can be sent to your provider as well.   To learn more about what you can do with MyChart, go to [email or web address].    Your next appointment:  1 year   Call in Feb to schedule May appointment     The format for your next appointment: Office   Provider: [facility]  {    Important Information About Sugar

## 2021-12-26 ENCOUNTER — Other Ambulatory Visit: Payer: Self-pay | Admitting: Family Medicine

## 2021-12-26 ENCOUNTER — Ambulatory Visit
Admission: RE | Admit: 2021-12-26 | Discharge: 2021-12-26 | Disposition: A | Discharge status: Home / Self Care | Payer: Medicare Other | Source: Ambulatory Visit | Attending: Family Medicine | Admitting: Family Medicine

## 2021-12-26 DIAGNOSIS — M546 Pain in thoracic spine: Secondary | ICD-10-CM | POA: Diagnosis not present

## 2021-12-26 DIAGNOSIS — R7989 Other specified abnormal findings of blood chemistry: Secondary | ICD-10-CM | POA: Diagnosis not present

## 2021-12-26 DIAGNOSIS — R918 Other nonspecific abnormal finding of lung field: Secondary | ICD-10-CM | POA: Diagnosis not present

## 2021-12-26 DIAGNOSIS — R079 Chest pain, unspecified: Secondary | ICD-10-CM | POA: Diagnosis not present

## 2021-12-26 MED ORDER — IOPAMIDOL (ISOVUE-370) INJECTION 76%
75.0000 mL | Freq: Once | INTRAVENOUS | Status: AC | PRN
  Administered 2021-12-26: 75 mL via INTRAVENOUS

## 2021-12-29 ENCOUNTER — Encounter: Payer: Self-pay | Admitting: Cardiovascular Disease

## 2022-01-17 DIAGNOSIS — E785 Hyperlipidemia, unspecified: Secondary | ICD-10-CM | POA: Diagnosis not present

## 2022-01-17 DIAGNOSIS — R002 Palpitations: Secondary | ICD-10-CM | POA: Diagnosis not present

## 2022-01-17 LAB — COMPREHENSIVE METABOLIC PANEL
ALT: 13 IU/L (ref 0–32)
AST: 19 IU/L (ref 0–40)
Albumin/Globulin Ratio: 1.8 (ref 1.2–2.2)
Albumin: 4.2 g/dL (ref 3.7–4.7)
Alkaline Phosphatase: 77 IU/L (ref 44–121)
BUN/Creatinine Ratio: 24 (ref 12–28)
BUN: 21 mg/dL (ref 8–27)
Bilirubin Total: 0.4 mg/dL (ref 0.0–1.2)
CO2: 26 mmol/L (ref 20–29)
Calcium: 9 mg/dL (ref 8.7–10.3)
Chloride: 106 mmol/L (ref 96–106)
Creatinine, Ser: 0.88 mg/dL (ref 0.57–1.00)
Globulin, Total: 2.3 g/dL (ref 1.5–4.5)
Glucose: 84 mg/dL (ref 70–99)
Potassium: 4.8 mmol/L (ref 3.5–5.2)
Sodium: 142 mmol/L (ref 134–144)
Total Protein: 6.5 g/dL (ref 6.0–8.5)
eGFR: 69 mL/min/{1.73_m2} (ref >59)

## 2022-01-17 LAB — CBC WITH DIFFERENTIAL/PLATELET
Basophils Absolute: 0 10*3/uL (ref 0.0–0.2)
Basos: 1 %
EOS (ABSOLUTE): 0.1 10*3/uL (ref 0.0–0.4)
Eos: 2 %
Hematocrit: 41.6 % (ref 34.0–46.6)
Hemoglobin: 14.1 g/dL (ref 11.1–15.9)
Immature Grans (Abs): 0 10*3/uL (ref 0.0–0.1)
Immature Granulocytes: 0 %
Lymphocytes Absolute: 1.5 10*3/uL (ref 0.7–3.1)
Lymphs: 29 %
MCH: 31.1 pg (ref 26.6–33.0)
MCHC: 33.9 g/dL (ref 31.5–35.7)
MCV: 92 fL (ref 79–97)
Monocytes Absolute: 0.4 10*3/uL (ref 0.1–0.9)
Monocytes: 8 %
Neutrophils Absolute: 3.2 10*3/uL (ref 1.4–7.0)
Neutrophils: 60 %
Platelets: 267 10*3/uL (ref 150–450)
RBC: 4.53 x10E6/uL (ref 3.77–5.28)
RDW: 12.2 % (ref 11.7–15.4)
WBC: 5.3 10*3/uL (ref 3.4–10.8)

## 2022-01-17 LAB — LIPID PANEL
Chol/HDL Ratio: 3 ratio (ref 0.0–4.4)
Cholesterol, Total: 199 mg/dL (ref 100–199)
HDL: 67 mg/dL (ref >39)
LDL Chol Calc (NIH): 121 mg/dL — ABNORMAL HIGH (ref 0–99)
Triglycerides: 58 mg/dL (ref 0–149)
VLDL Cholesterol Cal: 11 mg/dL (ref 5–40)

## 2022-01-17 LAB — TSH: TSH: 2.16 u[IU]/mL (ref 0.450–4.500)

## 2022-01-29 ENCOUNTER — Encounter: Payer: Self-pay | Admitting: Nurse Practitioner

## 2022-01-29 ENCOUNTER — Ambulatory Visit (INDEPENDENT_AMBULATORY_CARE_PROVIDER_SITE_OTHER): Payer: Medicare Other | Admitting: Nurse Practitioner

## 2022-01-29 VITALS — BP 118/76 | Ht 64.0 in | Wt 137.0 lb

## 2022-01-29 DIAGNOSIS — N393 Stress incontinence (female) (male): Secondary | ICD-10-CM | POA: Diagnosis not present

## 2022-01-29 DIAGNOSIS — Z780 Asymptomatic menopausal state: Secondary | ICD-10-CM | POA: Diagnosis not present

## 2022-01-29 DIAGNOSIS — Z01419 Encounter for gynecological examination (general) (routine) without abnormal findings: Secondary | ICD-10-CM

## 2022-01-29 DIAGNOSIS — M8589 Other specified disorders of bone density and structure, multiple sites: Secondary | ICD-10-CM

## 2022-01-29 DIAGNOSIS — Z9189 Other specified personal risk factors, not elsewhere classified: Secondary | ICD-10-CM | POA: Diagnosis not present

## 2022-02-06 ENCOUNTER — Ambulatory Visit (INDEPENDENT_AMBULATORY_CARE_PROVIDER_SITE_OTHER): Payer: Medicare Other

## 2022-02-06 ENCOUNTER — Other Ambulatory Visit: Payer: Self-pay | Admitting: Nurse Practitioner

## 2022-02-06 DIAGNOSIS — Z13820 Encounter for screening for osteoporosis: Secondary | ICD-10-CM

## 2022-02-06 DIAGNOSIS — Z9189 Other specified personal risk factors, not elsewhere classified: Secondary | ICD-10-CM

## 2022-02-06 DIAGNOSIS — M8589 Other specified disorders of bone density and structure, multiple sites: Secondary | ICD-10-CM | POA: Diagnosis not present

## 2022-02-06 DIAGNOSIS — Z780 Asymptomatic menopausal state: Secondary | ICD-10-CM

## 2022-04-22 DIAGNOSIS — Z23 Encounter for immunization: Secondary | ICD-10-CM | POA: Diagnosis not present

## 2022-04-26 DIAGNOSIS — Z23 Encounter for immunization: Secondary | ICD-10-CM | POA: Diagnosis not present

## 2022-05-07 DIAGNOSIS — Z1283 Encounter for screening for malignant neoplasm of skin: Secondary | ICD-10-CM | POA: Diagnosis not present

## 2022-05-07 DIAGNOSIS — L821 Other seborrheic keratosis: Secondary | ICD-10-CM | POA: Diagnosis not present

## 2022-06-18 DIAGNOSIS — I83893 Varicose veins of bilateral lower extremities with other complications: Secondary | ICD-10-CM | POA: Diagnosis not present

## 2022-06-18 DIAGNOSIS — Z86718 Personal history of other venous thrombosis and embolism: Secondary | ICD-10-CM | POA: Diagnosis not present

## 2022-06-18 DIAGNOSIS — M25561 Pain in right knee: Secondary | ICD-10-CM | POA: Diagnosis not present

## 2022-06-18 DIAGNOSIS — M79604 Pain in right leg: Secondary | ICD-10-CM | POA: Diagnosis not present

## 2022-06-21 DIAGNOSIS — M1711 Unilateral primary osteoarthritis, right knee: Secondary | ICD-10-CM | POA: Diagnosis not present

## 2022-07-04 ENCOUNTER — Ambulatory Visit (INDEPENDENT_AMBULATORY_CARE_PROVIDER_SITE_OTHER): Payer: Medicare Other | Admitting: Physician Assistant

## 2022-07-04 ENCOUNTER — Ambulatory Visit (HOSPITAL_COMMUNITY)
Admission: RE | Admit: 2022-07-04 | Discharge: 2022-07-04 | Disposition: A | Discharge status: Home / Self Care | Payer: Medicare Other | Source: Ambulatory Visit | Attending: Vascular Surgery | Admitting: Vascular Surgery

## 2022-07-04 ENCOUNTER — Other Ambulatory Visit: Payer: Self-pay | Admitting: *Deleted

## 2022-07-04 VITALS — BP 100/74 | HR 64 | Temp 98.2°F | Resp 20 | Ht 64.0 in | Wt 139.9 lb

## 2022-07-04 DIAGNOSIS — I83891 Varicose veins of right lower extremities with other complications: Secondary | ICD-10-CM | POA: Diagnosis not present

## 2022-07-04 NOTE — Progress Notes (Signed)
Requested by:  Almedia Balls, NP [address] [place],  [place] [address]  Reason for consultation: right leg swelling    History of Present Illness   Cynthia Rasmussen is a 76 y.o. (Jul 11, 1946) female who presents for evaluation of right leg swelling that has been going on for 6 months.  She states that she noticed her right knee swelling every once in a while for the past 6 months.  When she saw her primary care provider, they suggested that she consult with orthopedics for any knee issues and vascular surgery given that she has varicose veins in the right leg as well.  She states that she has had some right knee pain as well.   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  She states that they have gotten a little bit bigger with time.  They will occasionally cause her a burning sensation but do not bother her much.   She states a couple weeks ago she bought knee-high compression stockings and has been wearing them daily.  She also saw an orthopedic doctor where she got a cortisone shot for her knee.  She thinks that the cortisone and compression stockings have both helped with her knee and right lower leg swelling.  She denies any history of ulcerations of the right leg or previous vein procedures.  She endorses a one-time history of right lower extremity DVT about 5 to 10 years ago, to which she took a short course of anticoagulation.  She no longer requires any anticoagulation.  Past Medical History:  Diagnosis Date   Allergy    APC (atrial premature contractions)    DR. Claiborne Billings   DVT (deep venous thrombosis) (HCC)    Hx of echocardiogram 2010   Which showed normal systolic function as well as mitral valve E:A ratio of 0.94 with normal pulmonary artery pressures with RV pressure of 22. She did have evidence of mild thickening of her mitral valve leaflefts, and mitral valve prolapse cannot be completely excluded. She did have mild  mitral regurgitation. There also was moderate tricuspid regurgitation.   Osteopenia    Pulmonary embolus (HCC)    Thrombophlebitis    WITH PREGNANCY    Past Surgical History:  Procedure Laterality Date   BONE TUMOR EXCISION Right    x 2 benign   COLONOSCOPY  07/2017-[facility]   LEG SURGERY  1964   BENIGN BONE TUMOR; right leg   POLYPECTOMY     TONSILLECTOMY AND ADENOIDECTOMY     TUBAL LIGATION  1985   WISDOM TOOTH EXTRACTION  1965    Social History   Socioeconomic History   Marital status: Married    Spouse name: Not on file   Number of children: Not on file   Years of education: Not on file   Highest education level: Not on file  Occupational History   Occupation: Retired from [employer], worked in the office  Tobacco Use   Smoking status: Former    Types: Cigarettes    Quit date: 11/27/1975    Years since quitting: 46.6    Passive exposure: Never   Smokeless tobacco: Never  Vaping Use   Vaping Use: Never used  Substance and Sexual Activity   Alcohol use: Yes    Alcohol/week: 2.0 - 4.0 standard drinks of alcohol    Types: 2 - 4 Glasses of wine per week   Drug use: No  Sexual activity: Yes    Partners: Male    Comment: INTERCOURSE AGE 48, LESS THAN 5 SEXUAL PARTNES,DES NEG  Other Topics Concern   Not on file  Social History Narrative   Lives in [place] with her husband.    Social Determinants of Health   Financial Resource Strain: Not on file  Food Insecurity: Not on file  Transportation Needs: Not on file  Physical Activity: Not on file  Stress: Not on file  Social Connections: Not on file  Intimate Partner Violence: Not on file    Family History  Problem Relation Age of Onset   Osteoporosis Mother    Hypertension Father    Heart disease Father    Hypertension Sister    Cancer Maternal Uncle        LIVER   Colon cancer Neg Hx    Esophageal cancer Neg Hx    Rectal cancer Neg Hx    Stomach cancer Neg Hx    Pancreatic cancer Neg Hx     Prostate cancer Neg Hx     Current Outpatient Medications  Medication Sig Dispense Refill   FIBER PO Take by mouth daily.     Multiple Vitamin (MULTIVITAMIN) capsule Take 1 capsule by mouth daily.     No current facility-administered medications for this visit.    No Known Allergies  REVIEW OF SYSTEMS (negative unless checked):   Cardiac:  '[]'$  Chest pain or chest pressure? '[]'$  Shortness of breath upon activity? '[]'$  Shortness of breath when lying flat? '[]'$  Irregular heart rhythm?  Vascular:  '[]'$  Pain in calf, thigh, or hip brought on by walking? '[]'$  Pain in feet at night that wakes you up from your sleep? '[]'$  Blood clot in your veins? '[x]'$  Leg swelling?  Pulmonary:  '[]'$  Oxygen at home? '[]'$  Productive cough? '[]'$  Wheezing?  Neurologic:  '[]'$  Sudden weakness in arms or legs? '[]'$  Sudden numbness in arms or legs? '[]'$  Sudden onset of difficult speaking or slurred speech? '[]'$  Temporary loss of vision in one eye? '[]'$  Problems with dizziness?  Gastrointestinal:  '[]'$  Blood in stool? '[]'$  Vomited blood?  Genitourinary:  '[]'$  Burning when urinating? '[]'$  Blood in urine?  Psychiatric:  '[]'$  Major depression  Hematologic:  '[]'$  Bleeding problems? '[]'$  Problems with blood clotting?  Dermatologic:  '[]'$  Rashes or ulcers?  Constitutional:  '[]'$  Fever or chills?  Ear/Nose/Throat:  '[]'$  Change in hearing? '[]'$  Nose bleeds? '[]'$  Sore throat?  Musculoskeletal:  '[]'$  Back pain? '[]'$  Joint pain? '[]'$  Muscle pain?   Physical Examination     Vitals:   07/04/22 0914  BP: 100/74  Pulse: 64  Resp: 20  Temp: 98.2 F (36.8 C)  TempSrc: Temporal  SpO2: 100%  Weight: 139 lb 14.4 oz (63.5 kg)  Height: '5\' 4"'$  (1.626 m)   Body mass index is 24.01 kg/m.  General:  WDWN in NAD; vital signs documented above Gait: Not observed HENT: WNL, normocephalic Pulmonary: normal non-labored breathing, CTAB Cardiac: Regular rate and rhythm, no carotid bruits Abdomen: soft, NT, no masses Skin: without rashes Vascular  Exam/Pulses: Palpable DP pulses Extremities: With multiple varicose veins along the right shin.  No reticular veins, stasis pigmentation, lipodermatosclerosis, or ulcers.  Trace right lower extremity edema Musculoskeletal: no muscle wasting or atrophy  Neurologic: A&O X 3;  No focal weakness or paresthesias are detected Psychiatric:  The pt has Normal affect.  Non-invasive Vascular Imaging   RLE Venous Insufficiency Duplex (07/04/2022):   -+  RIGHT  Reflux NoRefluxReflux TimeDiameter cmsComments                                     Yes                                              +--------------+---------+------+-----------+------------+-----------------  -+  CFV                    yes   >1 second                                  +--------------+---------+------+-----------+------------+-----------------  -+  FV prox       no                                                         +--------------+---------+------+-----------+------------+-----------------  -+  FV mid        no                                                         +--------------+---------+------+-----------+------------+-----------------  -+  FV dist       no                                                         +--------------+---------+------+-----------+------------+-----------------  -+  Popliteal    no                                                         +--------------+---------+------+-----------+------------+-----------------  -+  GSV at SFJ              yes    >500 ms      1.26                         +--------------+---------+------+-----------+------------+-----------------  -+  GSV prox thigh          yes    >500 ms     0.766                         +--------------+---------+------+-----------+------------+-----------------  -+  GSV mid thigh           yes    >500 ms     0.595    then out of  fascia   +--------------+---------+------+-----------+------------+-----------------  -+  GSV dist thigh          yes    >500 ms      1.21    out of fascia        +--------------+---------+------+-----------+------------+-----------------  -+  GSV at knee             yes    >500 ms     0.699    out of fascia        +--------------+---------+------+-----------+------------+-----------------  -+  GSV prox calf           yes    >500 ms      0.62    out of fascia        +--------------+---------+------+-----------+------------+-----------------  -+  SSV Pop Fossa no                            0.17                         +--------------+---------+------+-----------+------------+-----------------  -+  SSV prox calf no                           0.244                         +--------------+---------+------+-----------+------------+-----------------  -+  SSV mid calf  no                            0.18                         +--------------+---------+------+-----------+------------+-----------------    Medical Decision Making   Cynthia Rasmussen is a 76 y.o. female who presents with right lower extremity edema and varicose veins  Based on the patient's venous duplex study, there is venous reflux in the right common femoral vein.  There is also reflux in the right greater saphenous vein beginning at the saphenofemoral junction all the way to the proximal calf.  There is no evidence of DVT The patient does have a history of right lower extremity edema, mostly localized in her knee which could be mixed causes from arthritic issues versus venous reflux.  Her swelling has greatly improved with the use of knee-high compression stockings and cortisone injections in the right knee She is not bothered by the varicose veins along her right shin.  At this time she would prefer to continue with conservative therapy including compressive stockings, exercise, and leg  elevation as needed She can follow-up with [place] as needed  Gerri Lins, PA-C [facility]: [phone]  07/04/2022, 12:39 PM  MD on call: Stanford Breed

## 2022-07-11 IMAGING — CT CT ANGIO CHEST
2 of 8 series · 10 of 36 positions shown · IV contrast (agent unspecified)
Comparison: 6233

CLINICAL DATA: Right posterior chest pain, elevated D-dimer

EXAM:
CT ANGIOGRAPHY CHEST WITH CONTRAST
TECHNIQUE: Multidetector CT imaging of the chest was performed using the
standard protocol during bolus administration of intravenous
contrast. Multiplanar CT image reconstructions and MIPs were
obtained to evaluate the vascular anatomy.

[Series 7: cta pulmonary 2.00 bv36 s3 · coronal · 0.60mm/px · 1 of 130 slices shown]
[im 65/130  mediastinal]
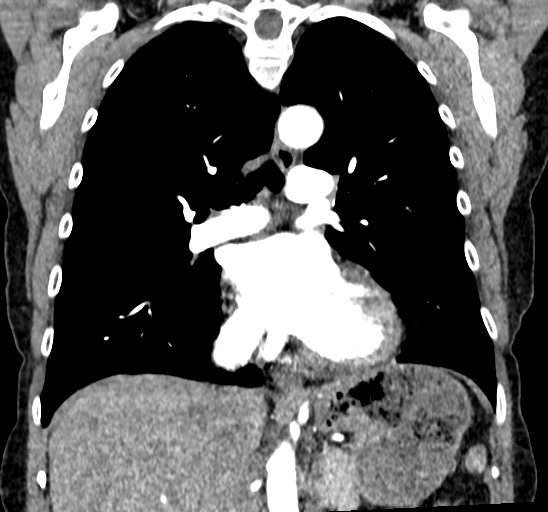

[Series 12: cta pulmonary 1.00 bv36 s3 super d. · axial · 0.64mm/px · z∈[+1521,+1772]mm · 9 of 393 slices shown]
[im 40/393  lung]
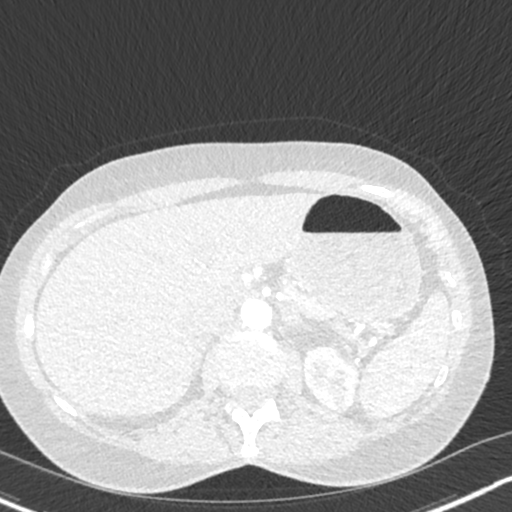
[im 79/393  mediastinal]
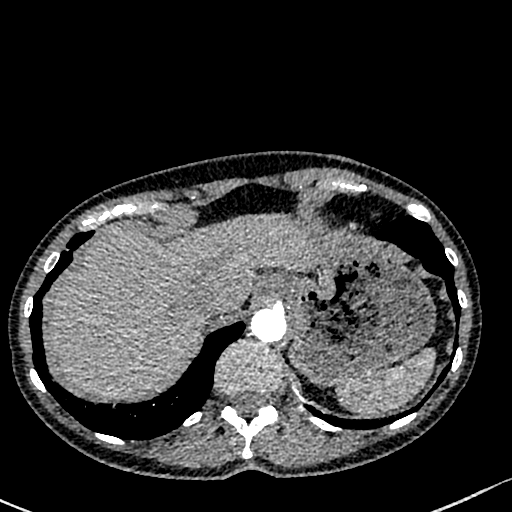
[im 118/393  lung]
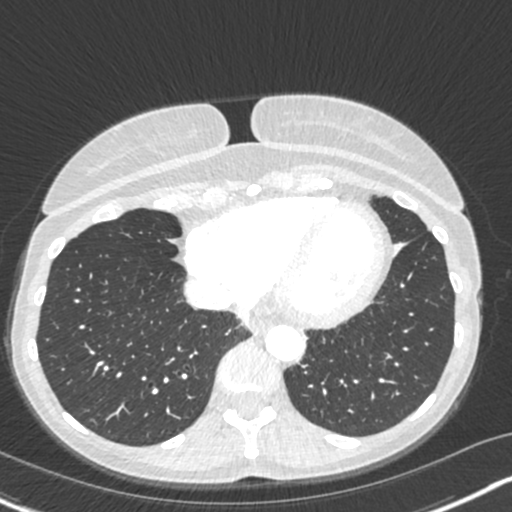
[im 157/393  mediastinal]
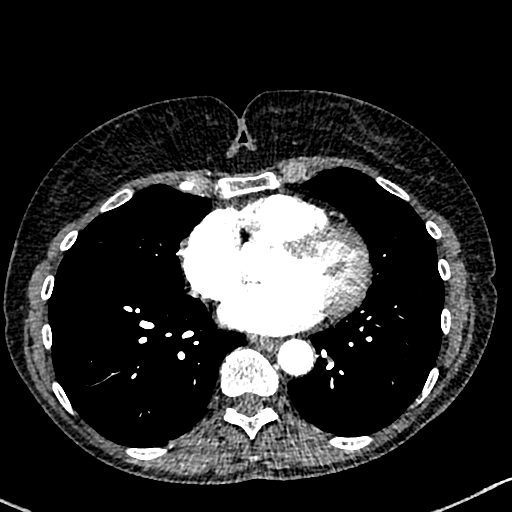
[im 197/393  lung]
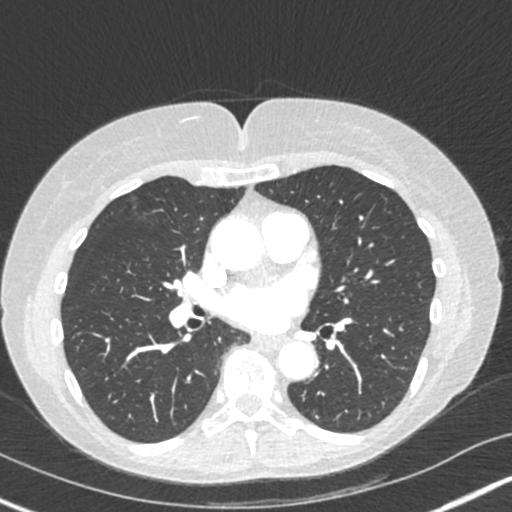
[im 236/393  mediastinal]
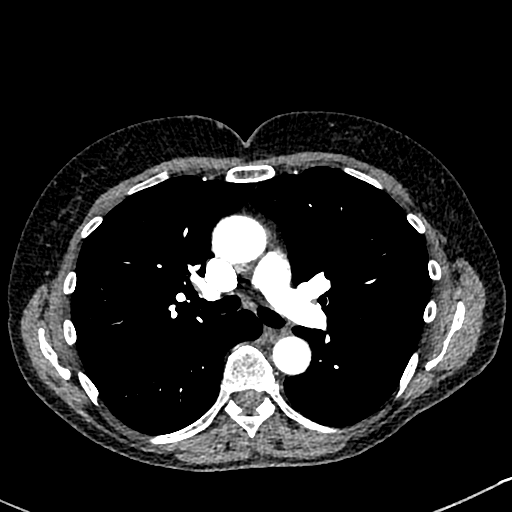
[im 275/393  lung]
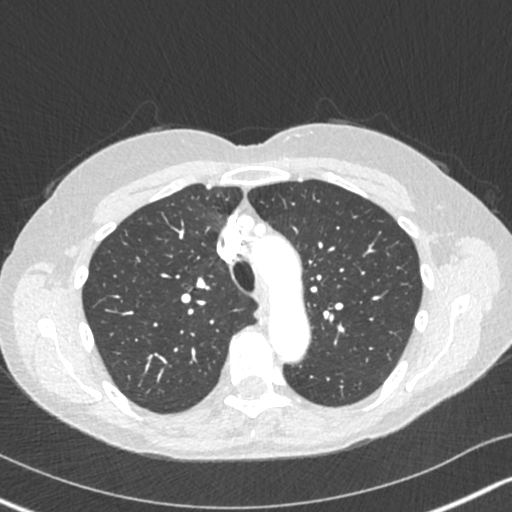
[im 314/393  mediastinal]
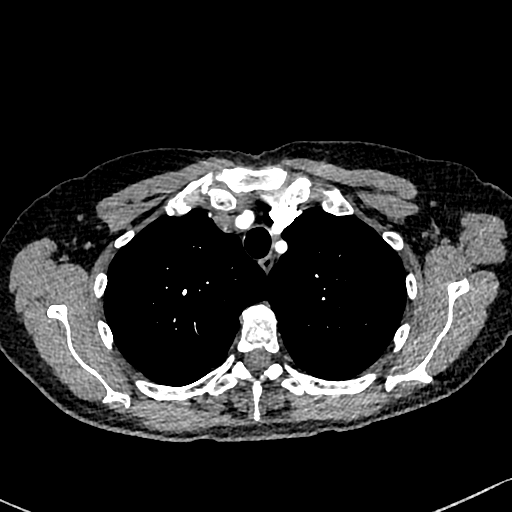
[im 353/393  lung]
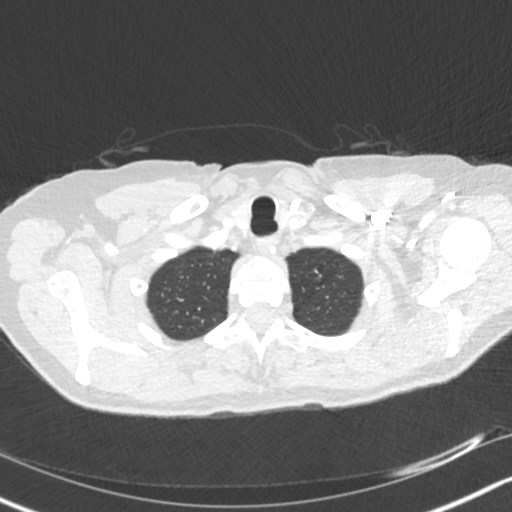

[10 of 36 positions shown; findings below may reference images not displayed]

RADIATION DOSE REDUCTION: This exam was performed according to the
departmental dose-optimization program which includes automated
exposure control, adjustment of the mA and/or kV according to
patient size and/or use of iterative reconstruction technique.

CONTRAST:  75mL 46AJ1C-7ZC IOPAMIDOL (46AJ1C-7ZC) INJECTION 76%
FINDINGS: Cardiovascular: Satisfactory opacification of the pulmonary arteries
to the segmental level. No evidence of pulmonary embolism. Normal
heart size. No pericardial effusion.

Mediastinum/Nodes: No enlarged nodes. Thyroid and esophagus are
unremarkable.

Lungs/Pleura: No consolidation or mass. No pleural effusion or
pneumothorax.

Upper Abdomen: No acute abnormality.

Musculoskeletal: No acute osseous abnormality.

Review of the MIP images confirms the above findings.
IMPRESSION: No acute pulmonary embolism or other acute abnormality.

These results will be called to the ordering clinician or
representative by the [HOSPITAL] at the imaging location.

## 2022-07-16 DIAGNOSIS — Z1231 Encounter for screening mammogram for malignant neoplasm of breast: Secondary | ICD-10-CM | POA: Diagnosis not present

## 2023-02-02 NOTE — Progress Notes (Unsigned)
Cardiology Clinic Note   Patient Name: Cynthia Rasmussen Date of Encounter: 02/05/2023  Primary Care Provider:  Noberto Retort, MD Primary Cardiologist:  Nicki Guadalajara, MD  Patient Profile    77 year old history of atrial ectopy/bigeminy with APCs as well as blocked APCs that has been treated with beta blocker therapy. Initially she developed significant fatigue on Toprol but has  Tolerated low dose bystolic. A 2-D echo Doppler study in 2010 showed normal systolic function and possible early borderline diastolic dysfunction with an E/A ratio of 0.94. She had normal pulmonary pressures. She did have mild thickening of her mitral valve leaflets without definitive prolapse and had mild mitral regurgitation, acute left DVT.    Past Medical History    Past Medical History:  Diagnosis Date   Allergy    APC (atrial premature contractions)    DR. Tresa Endo   DVT (deep venous thrombosis) (HCC)    Hx of echocardiogram 2010   Which showed normal systolic function as well as mitral valve E:A ratio of 0.94 with normal pulmonary artery pressures with RV pressure of 22. She did have evidence of mild thickening of her mitral valve leaflefts, and mitral valve prolapse cannot be completely excluded. She did have mild mitral regurgitation. There also was moderate tricuspid regurgitation.   Osteopenia    Pulmonary embolus (HCC)    Thrombophlebitis    WITH PREGNANCY   Past Surgical History:  Procedure Laterality Date   BONE TUMOR EXCISION Right    x 2 benign   COLONOSCOPY  07/2017-[facility]   LEG SURGERY  1964   BENIGN BONE TUMOR; right leg   POLYPECTOMY     TONSILLECTOMY AND ADENOIDECTOMY     TUBAL LIGATION  1985   WISDOM TOOTH EXTRACTION  1965    Allergies  No Known Allergies  History of Present Illness    Mrs. Aublel is a very pleasant lady who comes today for annual follow-up.  She has no cardiac complaints today.  She is not on any cardiac medications.  She only sees PCP if she is having any health  issues.  She denies any shortness of breath, chest pain, dizziness, fatigue.   Home Medications    Current Outpatient Medications  Medication Sig Dispense Refill   Calcium-Cholecalciferol-Zinc (VIACTIV CALCIUM IMMUNE) 650-20-5.5 MG-MCG-MG CHEW 1 tablet daily.     FIBER PO Take by mouth daily.     Multiple Vitamin (MULTIVITAMIN) capsule Take 1 capsule by mouth daily.     No current facility-administered medications for this visit.     Family History    Family History  Problem Relation Age of Onset   Osteoporosis Mother    Hypertension Father    Heart disease Father    Hypertension Sister    Cancer Maternal Uncle        LIVER   Colon cancer Neg Hx    Esophageal cancer Neg Hx    Rectal cancer Neg Hx    Stomach cancer Neg Hx    Pancreatic cancer Neg Hx    Prostate cancer Neg Hx    She indicated that her mother is deceased. She indicated that her father is deceased. She indicated that both of her sisters are alive. She indicated that her maternal uncle is deceased. She indicated that the status of her neg hx is unknown.  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    Social Determinants of Health   Financial Resource Strain: Not on file  Food Insecurity: Not on file  Transportation Needs: Not on file  Physical Activity: Not on file  Stress: Not on file  Social Connections: Not on file  Intimate Partner Violence: Not on file     Review of Systems    General:  No chills, fever, night sweats or weight changes.  Cardiovascular:  No chest pain, dyspnea on exertion, edema, orthopnea, palpitations, paroxysmal nocturnal dyspnea. Dermatological: No rash, lesions/masses Respiratory: No cough, dyspnea Urologic: No hematuria, dysuria Abdominal:   No nausea, vomiting, diarrhea, bright red blood per rectum, melena, or hematemesis Neurologic:  No visual changes, wkns, changes in mental status. All other systems reviewed and are otherwise negative except as noted above.  EKG Interpretation Date/Time:  Wednesday February 05 2023 13:52:02 EDT Ventricular Rate:  64 PR Interval:  144 QRS Duration:  90 QT Interval:  412 QTC Calculation: 425 R Axis:   17  Text Interpretation: Normal sinus rhythm Normal ECG No previous ECGs available Confirmed by Joni Reining [phone]) on 02/05/2023 2:16:04 PM    Physical Exam    VS:  BP 110/62 (BP Location: Right Arm, Patient Position: Sitting, Cuff Size: Normal)   Pulse 64   Ht 5\' 4"  (1.626 m)   Wt 141 lb 9.6 oz (64.2 kg)   SpO2 94%   BMI 24.31 kg/m  , BMI Body mass index is 24.31 kg/m.     GEN: Well nourished, well developed, in no acute distress. HEENT: normal. Neck: Supple, no JVD, carotid bruits, or masses. Cardiac: RRR, no murmurs, rubs, or gallops. No clubbing, cyanosis, edema.  Radials/DP/PT 2+ and equal bilaterally.  Respiratory:  Respirations regular and unlabored, clear to auscultation bilaterally. GI: Soft, nontender, nondistended, BS + x 4. MS: no deformity or atrophy. Skin: warm and dry, no rash. Neuro:  Strength and sensation are intact. Psych: Normal affect.  EKG  Interpretation Date/Time:  Wednesday February 05 2023 13:52:02 EDT Ventricular Rate:  64 PR Interval:  144 QRS Duration:  90 QT Interval:  412 QTC Calculation: 425 R Axis:   17  Text Interpretation: Normal sinus rhythm Normal ECG No previous ECGs available Confirmed by Joni Reining [phone]) on 02/05/2023 2:16:04 PM   Lab Results  Component Value Date   WBC 5.3 01/17/2022   HGB 14.1 01/17/2022   HCT 41.6 01/17/2022   MCV 92 01/17/2022   PLT 267 01/17/2022   Lab Results  Component Value Date   CREATININE 0.88 01/17/2022  BUN 21 01/17/2022   NA 142 01/17/2022   K 4.8 01/17/2022   CL 106 01/17/2022   CO2 26 01/17/2022   Lab Results  Component Value Date   ALT 13 01/17/2022   AST 19 01/17/2022   ALKPHOS 77 01/17/2022   BILITOT 0.4 01/17/2022   Lab Results  Component Value Date   CHOL 199 01/17/2022   HDL 67 01/17/2022   LDLCALC 121 (H) 01/17/2022   TRIG 58 01/17/2022   CHOLHDL 3.0 01/17/2022    Lab Results  Component Value Date   HGBA1C 5.9 (H) 09/09/2012     Review of Prior Studies EKG Interpretation Date/Time:  Wednesday February 05 2023 13:52:02 EDT Ventricular Rate:  64 PR Interval:  144 QRS Duration:  90 QT Interval:  412 QTC Calculation: 425 R Axis:   17  Text Interpretation: Normal sinus rhythm Normal ECG No previous ECGs available Confirmed by Joni Reining [phone]) on 02/05/2023 2:16:04 PM  Echocardiogram 07/22/2017 Left ventricle: Systolic function was normal. The estimated    ejection fraction was in the range of 60% to 65%. Wall motion was    normal; there were no regional wall motion abnormalities. Doppler    parameters are consistent with abnormal left ventricular    relaxation (grade 1 diastolic dysfunction). The E/e&' ratio is    between 8-15, suggesting indeterminate LV filling pressure.  - Left atrium: The atrium was normal in size.  - Right ventricle: The cavity size was normal. Wall thickness was    normal. Systolic function was normal.   - Right atrium: The atrium was normal in size.  - Inferior vena cava: The vessel was normal in size. The    respirophasic diameter changes were in the normal range (= 50%),    consistent with normal central venous pressure.   Assessment & Plan   1.  Mitral valve regurgitation: Completely asymptomatic.  Can follow-up with echocardiograms when she does become symptomatic currently she is not on any medications, she  has not restricted in her activities.  2.  Hyperlipidemia:  labs have not been completed in over a year.  She will have fasting lipids and LFTs for ongoing screening and management.  Not currently on statin therapy.  3.  History of DVT: No longer on anticoagulation therapy.  Doing very well.  Annually.       Signed, Bettey Mare. Liborio Nixon, ANP, AACC   02/05/2023 2:58 PM      Office [phone] Fax [phone]  Notice: This dictation was prepared with Dragon dictation along with smaller phrase technology. Any transcriptional errors that result from this process are unintentional and may not be corrected upon review.

## 2023-02-05 ENCOUNTER — Encounter: Payer: Self-pay | Admitting: Adult Health

## 2023-02-05 ENCOUNTER — Ambulatory Visit: Payer: Medicare Other | Attending: Adult Health | Admitting: Adult Health

## 2023-02-05 VITALS — BP 110/62 | HR 64 | Ht 64.0 in | Wt 141.6 lb

## 2023-02-05 DIAGNOSIS — I340 Nonrheumatic mitral (valve) insufficiency: Secondary | ICD-10-CM | POA: Diagnosis not present

## 2023-02-05 DIAGNOSIS — E785 Hyperlipidemia, unspecified: Secondary | ICD-10-CM

## 2023-02-05 NOTE — Patient Instructions (Signed)
Medication Instructions:  No Changes *If you need a refill on your cardiac medications before your next appointment, please call your pharmacy*   Lab Work: CBC, CMET, Lipid Panel If you have labs (blood work) drawn today and your tests are completely normal, you will receive your results only by: MyChart Message (if you have MyChart) OR A paper copy in the mail If you have any lab test that is abnormal or we need to change your treatment, we will call you to review the results.   Testing/Procedures: No Testing   Follow-Up: At [facility], you and your health needs are our priority.  As part of our continuing mission to provide you with exceptional heart care, we have created designated Provider Care Teams.  These Care Teams include your primary Cardiologist (physician) and Advanced Practice Providers (APPs -  Physician Assistants and Nurse Practitioners) who all work together to provide you with the care you need, when you need it.  We recommend signing up for the patient portal called "MyChart".  Sign up information is provided on this After Visit Summary.  MyChart is used to connect with patients for Virtual Visits (Telemedicine).  Patients are able to view lab/test results, encounter notes, upcoming appointments, etc.  Non-urgent messages can be sent to your provider as well.   To learn more about what you can do with MyChart, go to [email or web address].    Your next appointment:   1 year(s)  Provider:   Nicki Guadalajara, MD

## 2023-02-10 DIAGNOSIS — E785 Hyperlipidemia, unspecified: Secondary | ICD-10-CM | POA: Diagnosis not present

## 2023-02-10 LAB — COMPREHENSIVE METABOLIC PANEL
ALT: 13 IU/L (ref 0–32)
AST: 22 IU/L (ref 0–40)
Albumin: 4.4 g/dL (ref 3.8–4.8)
Alkaline Phosphatase: 76 IU/L (ref 44–121)
BUN/Creatinine Ratio: 17 (ref 12–28)
BUN: 15 mg/dL (ref 8–27)
Bilirubin Total: 0.5 mg/dL (ref 0.0–1.2)
CO2: 27 mmol/L (ref 20–29)
Calcium: 9.1 mg/dL (ref 8.7–10.3)
Chloride: 105 mmol/L (ref 96–106)
Creatinine, Ser: 0.86 mg/dL (ref 0.57–1.00)
Globulin, Total: 2.4 g/dL (ref 1.5–4.5)
Glucose: 87 mg/dL (ref 70–99)
Potassium: 4.6 mmol/L (ref 3.5–5.2)
Sodium: 143 mmol/L (ref 134–144)
Total Protein: 6.8 g/dL (ref 6.0–8.5)
eGFR: 71 mL/min/{1.73_m2} (ref >59)

## 2023-02-10 LAB — LIPID PANEL
Chol/HDL Ratio: 3 ratio (ref 0.0–4.4)
Cholesterol, Total: 200 mg/dL — ABNORMAL HIGH (ref 100–199)
HDL: 66 mg/dL (ref >39)
LDL Chol Calc (NIH): 116 mg/dL — ABNORMAL HIGH (ref 0–99)
Triglycerides: 104 mg/dL (ref 0–149)
VLDL Cholesterol Cal: 18 mg/dL (ref 5–40)

## 2023-02-11 LAB — CBC
Hematocrit: 42.4 % (ref 34.0–46.6)
Hemoglobin: 14 g/dL (ref 11.1–15.9)
MCH: 30.4 pg (ref 26.6–33.0)
MCHC: 33 g/dL (ref 31.5–35.7)
MCV: 92 fL (ref 79–97)
Platelets: 254 10*3/uL (ref 150–450)
RBC: 4.61 x10E6/uL (ref 3.77–5.28)
RDW: 12.3 % (ref 11.7–15.4)
WBC: 6.2 10*3/uL (ref 3.4–10.8)

## 2023-02-12 ENCOUNTER — Telehealth: Payer: Self-pay

## 2023-02-12 NOTE — Telephone Encounter (Addendum)
Called patient regarding results. Patient had understanding of results.----- Message from Jodelle Gross, NP sent at 02/11/2023  2:28 PM EDT ----- I have reviewed her labs. Cholesterol slightly elevated. Not on meds for this. Send low cholesterol diet. Will check again in 6 months. If still elevated can consider Zetia.  KL

## 2023-03-30 DIAGNOSIS — R918 Other nonspecific abnormal finding of lung field: Secondary | ICD-10-CM | POA: Diagnosis not present

## 2023-03-30 DIAGNOSIS — R051 Acute cough: Secondary | ICD-10-CM | POA: Diagnosis not present

## 2023-03-30 DIAGNOSIS — J189 Pneumonia, unspecified organism: Secondary | ICD-10-CM | POA: Diagnosis not present

## 2023-04-29 DIAGNOSIS — Z23 Encounter for immunization: Secondary | ICD-10-CM | POA: Diagnosis not present

## 2023-05-06 DIAGNOSIS — Z23 Encounter for immunization: Secondary | ICD-10-CM | POA: Diagnosis not present

## 2023-07-17 DIAGNOSIS — Z1231 Encounter for screening mammogram for malignant neoplasm of breast: Secondary | ICD-10-CM | POA: Diagnosis not present

## 2023-07-24 ENCOUNTER — Other Ambulatory Visit: Payer: Self-pay | Admitting: Medical Genetics

## 2023-08-04 ENCOUNTER — Other Ambulatory Visit
Admission: RE | Admit: 2023-08-04 | Discharge: 2023-08-04 | Disposition: A | Discharge status: Home / Self Care | Payer: Self-pay | Source: Ambulatory Visit | Attending: Medical Genetics | Admitting: Medical Genetics

## 2023-09-04 LAB — GENECONNECT MOLECULAR SCREEN: Genetic Analysis Overall Interpretation: NEGATIVE

## 2023-09-12 DIAGNOSIS — Z23 Encounter for immunization: Secondary | ICD-10-CM | POA: Diagnosis not present

## 2023-10-16 DIAGNOSIS — H353121 Nonexudative age-related macular degeneration, left eye, early dry stage: Secondary | ICD-10-CM | POA: Diagnosis not present

## 2023-10-16 DIAGNOSIS — H43813 Vitreous degeneration, bilateral: Secondary | ICD-10-CM | POA: Diagnosis not present

## 2023-10-16 DIAGNOSIS — H353112 Nonexudative age-related macular degeneration, right eye, intermediate dry stage: Secondary | ICD-10-CM | POA: Diagnosis not present

## 2023-10-16 DIAGNOSIS — H35371 Puckering of macula, right eye: Secondary | ICD-10-CM | POA: Diagnosis not present

## 2023-12-19 ENCOUNTER — Telehealth: Payer: Self-pay | Admitting: *Deleted

## 2023-12-19 NOTE — Telephone Encounter (Signed)
-----   Message from Elmsford L sent at 12/19/2023  1:43 PM EDT ----- Regarding: Bone Density Scan The patient called because she received a letter. She would like to go to [place] since were not doing them in our office currently. She would like a call back from you at [phone] once the order has been put in so she can call and make her appointment.       Thank you Mylinda Asa

## 2023-12-22 NOTE — Telephone Encounter (Signed)
 AEX 01/29/22-TW BMD at [facility] 02/06/22 -osteopenia  No AEX scheduled  [facility] BMD order printed and to Tiffany to review and sign.   Dx: Z78.0, M85.89

## 2023-12-23 NOTE — Telephone Encounter (Signed)
Order re-printed

## 2023-12-23 NOTE — Telephone Encounter (Signed)
 Call returned to patient. Left detailed message, ok per dpr. Advised BMD order completed, will be faxed to [facility] by end of business day. Return call to office if any additional questions or assistance needed.   Encounter closed.

## 2023-12-23 NOTE — Telephone Encounter (Signed)
 Signed.

## 2024-02-12 DIAGNOSIS — E2839 Other primary ovarian failure: Secondary | ICD-10-CM | POA: Diagnosis not present

## 2024-02-12 DIAGNOSIS — M8588 Other specified disorders of bone density and structure, other site: Secondary | ICD-10-CM | POA: Diagnosis not present

## 2024-02-12 DIAGNOSIS — Z8262 Family history of osteoporosis: Secondary | ICD-10-CM | POA: Diagnosis not present

## 2024-02-12 DIAGNOSIS — Z780 Asymptomatic menopausal state: Secondary | ICD-10-CM | POA: Diagnosis not present

## 2024-02-12 LAB — HM DEXA SCAN

## 2024-02-17 ENCOUNTER — Encounter: Payer: Self-pay | Admitting: Nurse Practitioner

## 2024-02-17 ENCOUNTER — Ambulatory Visit: Payer: Self-pay | Admitting: Nurse Practitioner

## 2024-03-04 DIAGNOSIS — Z23 Encounter for immunization: Secondary | ICD-10-CM | POA: Diagnosis not present

## 2024-04-06 ENCOUNTER — Encounter: Payer: Self-pay | Admitting: Nurse Practitioner

## 2024-04-06 ENCOUNTER — Ambulatory Visit (INDEPENDENT_AMBULATORY_CARE_PROVIDER_SITE_OTHER): Admitting: Nurse Practitioner

## 2024-04-06 VITALS — BP 110/70 | HR 60 | Ht 64.0 in | Wt 140.0 lb

## 2024-04-06 DIAGNOSIS — Z780 Asymptomatic menopausal state: Secondary | ICD-10-CM

## 2024-04-06 DIAGNOSIS — Z9189 Other specified personal risk factors, not elsewhere classified: Secondary | ICD-10-CM

## 2024-04-06 DIAGNOSIS — Z01419 Encounter for gynecological examination (general) (routine) without abnormal findings: Secondary | ICD-10-CM | POA: Diagnosis not present

## 2024-04-06 DIAGNOSIS — M8589 Other specified disorders of bone density and structure, multiple sites: Secondary | ICD-10-CM

## 2024-04-06 NOTE — Progress Notes (Addendum)
 Cynthia Rasmussen 05-May-1946 [id number]   History:  78 y.o. G3 P3 presents for breast and pelvic exam. Postmenopausal - no HRT, no bleeding. History of DVT, PE, premature atrial contractions, followed by cardiology. Osteopenia with elevated FRAX. Mother with history of osteoporosis.   Gynecologic History No LMP recorded. Patient is postmenopausal. Sexually active: Yes  Health maintenance Last Pap: 2015. Results were: Normal Last mammogram: 07/2023. Results were: normal Last colonoscopy: 11/20/2020. Results were: Pre-cancerous polyp, 5-year recall Last Dexa: 02/12/2024. Results were: T-score -1.6, FRAX 21% / 11%  Past medical history, past surgical history, family history and social history were all reviewed and documented in the EPIC chart. Married. 3 children, 2 live local. 2 grandchildren. Has 2 german shepherds.   ROS:  A ROS was performed and pertinent positives and negatives are included.  Exam:  Vitals:   04/06/24 1130  BP: 110/70  Pulse: 60  SpO2: 100%  Weight: 140 lb (63.5 kg)  Height: 5' 4 (1.626 m)     Body mass index is 24.03 kg/m.  General appearance:  Normal Thyroid :  Symmetrical, normal in size, without palpable masses or nodularity. Respiratory  Auscultation:  Clear without wheezing or rhonchi Cardiovascular  Auscultation:  Regular rate, without rubs, murmurs or gallops  Edema/varicosities:  Not grossly evident Abdominal  Soft,nontender, without masses, guarding or rebound.  Liver/spleen:  No organomegaly noted  Hernia:  None appreciated  Skin  Inspection:  Grossly normal   Breasts: Examined lying and sitting.   Right: Without masses, retractions, discharge or axillary adenopathy.   Left: Without masses, retractions, discharge or axillary adenopathy. Pelvic: External genitalia:  no lesions              Urethra:  normal appearing urethra with no masses, tenderness or lesions              Bartholins and Skenes: normal                 Vagina: normal  appearing vagina with normal color and discharge, no lesions. Atrophic changes              Cervix: no lesions Bimanual Exam:  Uterus:  no masses or tenderness              Adnexa: no mass, fullness, tenderness              Rectovaginal: Deferred              Anus:  normal, no lesions  Dereck Keas, CMA present as chaperone.   Assessment/Plan:  78 y.o. G3 P3 for breast and pelvic exam.   Encounter for breast and pelvic examination - Education provided on SBEs, importance of preventative screenings, current guidelines, high calcium diet, regular exercise, and multivitamin daily.  Labs with PCP.   Postmenopausal - No HRT, no bleeding.   Osteopenia of multiple sites - 02/2024 T-score -1.6 with elevated FRAX of 21% and 11%. Discussed medication management options. Will consider. Continue Vit D, high calcium diet and regular exercise. Walks 4-5 days per week, has not been as consistent with weight training but plans to start back.   Fracture Risk Assessment Score (FRAX) indicating greater than 20% risk for major osteoporosis-related fracture - 21%. Reviewed DXA report and fracture risk and recommendations.   Fracture Risk Assessment Score (FRAX) indicating greater than 3% risk for hip fracture - 11%. Reviewed DXA report and fracture risk and recommendations.   Screening for cervical cancer - Normal Pap history.  No  longer screening per guidelines.   Screening for breast cancer - Normal mammogram history.  Continue annual screenings.  Normal breast exam today.  Screening for colon cancer - 2022 colonoscopy. Will repeat at 5-year interval per GI's recommendation.   Return in about 2 years (around 04/06/2026) for B&P.      Cynthia Rasmussen [facility], 12:02 PM 04/06/2024

## 2024-04-16 DIAGNOSIS — Z23 Encounter for immunization: Secondary | ICD-10-CM | POA: Diagnosis not present

## 2024-04-20 DIAGNOSIS — H353121 Nonexudative age-related macular degeneration, left eye, early dry stage: Secondary | ICD-10-CM | POA: Diagnosis not present

## 2024-04-20 DIAGNOSIS — H353112 Nonexudative age-related macular degeneration, right eye, intermediate dry stage: Secondary | ICD-10-CM | POA: Diagnosis not present

## 2024-06-01 ENCOUNTER — Ambulatory Visit: Attending: Internal Medicine | Admitting: Internal Medicine

## 2024-06-01 ENCOUNTER — Encounter: Payer: Self-pay | Admitting: Internal Medicine

## 2024-06-01 VITALS — BP 112/78 | HR 63 | Resp 16 | Ht 64.0 in | Wt 140.3 lb

## 2024-06-01 DIAGNOSIS — Z86718 Personal history of other venous thrombosis and embolism: Secondary | ICD-10-CM | POA: Diagnosis not present

## 2024-06-01 DIAGNOSIS — I340 Nonrheumatic mitral (valve) insufficiency: Secondary | ICD-10-CM | POA: Diagnosis not present

## 2024-06-01 NOTE — Patient Instructions (Signed)
 Medication Instructions:  NO CHANGES  *If you need a refill on your cardiac medications before your next appointment, please call your pharmacy*  Follow-Up: At [facility], you and your health needs are our priority.  As part of our continuing mission to provide you with exceptional heart care, our providers are all part of one team.  This team includes your primary Cardiologist (physician) and Advanced Practice Providers or APPs (Physician Assistants and Nurse Practitioners) who all work together to provide you with the care you need, when you need it.  Your next appointment:    AS NEEDED with Dr. Maximo Spar  We recommend signing up for the patient portal called "MyChart".  Sign up information is provided on this After Visit Summary.  MyChart is used to connect with patients for Virtual Visits (Telemedicine).  Patients are able to view lab/test results, encounter notes, upcoming appointments, etc.  Non-urgent messages can be sent to your provider as well.   To learn more about what you can do with MyChart, go to [email or web address].

## 2024-06-01 NOTE — Progress Notes (Signed)
 OFFICE NOTE  Chief Complaint:  Establish cardiologist  Primary Care Physician: Arloa Elsie SAUNDERS, MD  HPI:  Cynthia Rasmussen is a 78 y.o. female with a past medial history significant for mitral regurgitation which was seen in echo around 2010 however subsequent echo showed very little mild regurgitation or prolapse.  She is here to establish care with me and previously was followed by Dr. Charlena Sor.  She has a history of DVT and was anticoagulated in the past however this resolved.  She is currently on no medications.  She denies any chest pain or shortness of breath.  She had lipid testing in July 2024 which showed total cholesterol 200, HDL 66, triglycerides 104 and LDL 116.  She has had prior chest CT scans which failed to show any coronary calcium.  There was family history in her father of heart disease.  She has no hypertension, diabetes or obesity.  She is active and denies any symptoms with exertion.  PMHx:  Past Medical History:  Diagnosis Date   Allergy    APC (atrial premature contractions)    DR. SOR   DVT (deep venous thrombosis) (HCC)    Hx of echocardiogram 2010   Which showed normal systolic function as well as mitral valve E:A ratio of 0.94 with normal pulmonary artery pressures with RV pressure of 22. She did have evidence of mild thickening of her mitral valve leaflefts, and mitral valve prolapse cannot be completely excluded. She did have mild mitral regurgitation. There also was moderate tricuspid regurgitation.   Osteopenia    Pulmonary embolus (HCC)    Thrombophlebitis    WITH PREGNANCY    Past Surgical History:  Procedure Laterality Date   BONE TUMOR EXCISION Right    x 2 benign   COLONOSCOPY  07/2017-[facility]   LEG SURGERY  1964   BENIGN BONE TUMOR; right leg   POLYPECTOMY     TONSILLECTOMY AND ADENOIDECTOMY     TUBAL LIGATION  1985   WISDOM TOOTH EXTRACTION  1965    FAMHx:  Family History  Problem Relation Age of Onset   Osteoporosis Mother     Hypertension Father    Heart disease Father    Hypertension Sister    Cancer Maternal Uncle        LIVER   Colon cancer Neg Hx    Esophageal cancer Neg Hx    Rectal cancer Neg Hx    Stomach cancer Neg Hx    Pancreatic cancer Neg Hx    Prostate cancer Neg Hx     SOCHx:   reports that she quit smoking about 48 years ago. Her smoking use included cigarettes. She has never been exposed to tobacco smoke. She has never used smokeless tobacco. She reports current alcohol use of about 2.0 - 4.0 standard drinks of alcohol per week. She reports that she does not use drugs.  ALLERGIES:  No Known Allergies  ROS: Pertinent items noted in HPI and remainder of comprehensive ROS otherwise negative.  HOME MEDS: Current Outpatient Medications on File Prior to Visit  Medication Sig Dispense Refill   Calcium-Cholecalciferol-Zinc (VIACTIV CALCIUM IMMUNE) 650-20-5.5 MG-MCG-MG CHEW 1 tablet daily.     FIBER PO Take by mouth daily.     Multiple Vitamin (MULTIVITAMIN) capsule Take 1 capsule by mouth daily.     multivitamin-lutein (OCUVITE-LUTEIN) CAPS capsule Take 1 capsule by mouth daily.     No current facility-administered medications on file prior to visit.    LABS/IMAGING: No results  found for this or any previous visit (from the past 48 hours). No results found.  LIPID PANEL:    Component Value Date/Time   CHOL 200 (H) 02/10/2023 1100   TRIG 104 02/10/2023 1100   HDL 66 02/10/2023 1100   CHOLHDL 3.0 02/10/2023 1100   CHOLHDL 3.5 07/22/2017 0511   VLDL 23 07/22/2017 0511   LDLCALC 116 (H) 02/10/2023 1100     WEIGHTS: Wt Readings from Last 3 Encounters:  06/01/24 140 lb 4.8 oz (63.6 kg)  04/06/24 140 lb (63.5 kg)  02/05/23 141 lb 9.6 oz (64.2 kg)    VITALS: BP 112/78 (BP Location: Right Arm, Patient Position: Sitting, Cuff Size: Large)   Pulse 63   Resp 16   Ht 5' 4 (1.626 m)   Wt 140 lb 4.8 oz (63.6 kg)   SpO2 98%   BMI 24.08 kg/m   EXAM: General appearance: alert and  no distress Lungs: clear to auscultation bilaterally Heart: regular rate and rhythm, S1, S2 normal, no murmur, click, rub or gallop Extremities: extremities normal, atraumatic, no cyanosis or edema Neurologic: Grossly normal   EKG: EKG Interpretation Date/Time:  Tuesday June 01 2024 09:39:48 EDT Ventricular Rate:  64 PR Interval:  148 QRS Duration:  90 QT Interval:  436 QTC Calculation: 449 R Axis:   47  Text Interpretation: Normal sinus rhythm Normal ECG When compared with ECG of 05-Feb-2023 13:52, No significant change was found Confirmed by Mona Kent [phone]) on 06/01/2024 9:50:44 AM - personally reviewed  ASSESSMENT: History of mitral valve disease which was improved by echo Remote history of DVT, resolved Family history of cardiovascular disease  PLAN: 1.   Cynthia Rasmussen had a remote history of mitral valve disease however this has improved by echo.  She is asymptomatic and stays active.  She had a remote history of DVT which is resolved.  She does not take any medications.  There is a family history of heart disease however she seems to be fairly low risk beyond this.  I think she can follow-up with us  in cardiology as needed.  I encouraged her to follow-up with her primary care provider who she does not see regularly for general physical examinations for prevention.  Follow-up as needed.  Kent KYM Mona, MD, [facility], FNLA, FACP  [facility]  Medical Director of the [facility] Lipid [facility]  Cardiovascular Risk Reduction Clinic Diplomate of the American Board of Clinical Lipidology Attending Cardiologist  Direct Dial: [phone]  Fax: [phone]  Website:  [email or web address]   Kent BROCKS Latora Quarry 06/01/2024, 10:36 AM
